# Patient Record
Sex: Female | Born: 1980 | Race: Black or African American | Hispanic: No | Marital: Single | Smoking: Never smoker
Health system: Southern US, Community
[De-identification: ages and names within clinical notes are randomized; demographics above are authoritative.]

## PROBLEM LIST (undated history)

## (undated) DIAGNOSIS — N62 Hypertrophy of breast: Secondary | ICD-10-CM

## (undated) DIAGNOSIS — Z975 Presence of (intrauterine) contraceptive device: Secondary | ICD-10-CM

## (undated) DIAGNOSIS — Z98811 Dental restoration status: Secondary | ICD-10-CM

## (undated) HISTORY — PX: WISDOM TOOTH EXTRACTION: SHX21

---

## 2007-09-17 ENCOUNTER — Ambulatory Visit: Payer: Self-pay | Admitting: Internal Medicine

## 2009-09-03 ENCOUNTER — Ambulatory Visit: Payer: Self-pay | Admitting: Family Medicine

## 2009-09-03 ENCOUNTER — Encounter: Payer: Self-pay | Admitting: Family Medicine

## 2009-09-03 DIAGNOSIS — N390 Urinary tract infection, site not specified: Secondary | ICD-10-CM | POA: Insufficient documentation

## 2009-09-03 DIAGNOSIS — R300 Dysuria: Secondary | ICD-10-CM | POA: Insufficient documentation

## 2009-09-03 LAB — CONVERTED CEMR LAB
Glucose, Urine, Semiquant: NEGATIVE
Ketones, urine, test strip: NEGATIVE
Nitrite: NEGATIVE
Protein, U semiquant: NEGATIVE
Specific Gravity, Urine: 1.02
Urobilinogen, UA: 0.2
pH: 6

## 2009-09-04 ENCOUNTER — Encounter: Payer: Self-pay | Admitting: Family Medicine

## 2009-09-22 ENCOUNTER — Ambulatory Visit: Payer: Self-pay | Admitting: Family Medicine

## 2009-09-23 LAB — CONVERTED CEMR LAB
ALT: 9 units/L (ref 0–35)
AST: 14 units/L (ref 0–37)
Albumin: 3.1 g/dL — ABNORMAL LOW (ref 3.5–5.2)
Alkaline Phosphatase: 44 units/L (ref 39–117)
BUN: 4 mg/dL — ABNORMAL LOW (ref 6–23)
Basophils Absolute: 0 10*3/uL (ref 0.0–0.1)
Basophils Relative: 0.6 % (ref 0.0–3.0)
Bilirubin, Direct: 0.1 mg/dL (ref 0.0–0.3)
CO2: 27 meq/L (ref 19–32)
Calcium: 9 mg/dL (ref 8.4–10.5)
Chloride: 106 meq/L (ref 96–112)
Cholesterol: 174 mg/dL (ref 0–200)
Creatinine, Ser: 0.8 mg/dL (ref 0.4–1.2)
Eosinophils Absolute: 0.1 10*3/uL (ref 0.0–0.7)
Eosinophils Relative: 1.9 % (ref 0.0–5.0)
GFR calc non Af Amer: 108.1 mL/min (ref >60)
Glucose, Bld: 71 mg/dL (ref 70–99)
HCT: 35.8 % — ABNORMAL LOW (ref 36.0–46.0)
HDL: 61.8 mg/dL (ref >39.00)
Hemoglobin: 11.4 g/dL — ABNORMAL LOW (ref 12.0–15.0)
Hgb A1c MFr Bld: 5.2 % (ref 4.6–6.5)
LDL Cholesterol: 92 mg/dL (ref 0–99)
Lymphocytes Relative: 25.9 % (ref 12.0–46.0)
Lymphs Abs: 1.9 10*3/uL (ref 0.7–4.0)
MCHC: 31.7 g/dL (ref 30.0–36.0)
MCV: 85.8 fL (ref 78.0–100.0)
Monocytes Absolute: 0.4 10*3/uL (ref 0.1–1.0)
Monocytes Relative: 5.8 % (ref 3.0–12.0)
Neutro Abs: 5 10*3/uL (ref 1.4–7.7)
Neutrophils Relative %: 65.8 % (ref 43.0–77.0)
Platelets: 313 10*3/uL (ref 150.0–400.0)
Potassium: 3.4 meq/L — ABNORMAL LOW (ref 3.5–5.1)
RBC: 4.17 M/uL (ref 3.87–5.11)
RDW: 11.9 % (ref 11.5–14.6)
Sodium: 139 meq/L (ref 135–145)
TSH: 1.81 microintl units/mL (ref 0.35–5.50)
Total Bilirubin: 0.3 mg/dL (ref 0.3–1.2)
Total CHOL/HDL Ratio: 3
Total Protein: 6.7 g/dL (ref 6.0–8.3)
Triglycerides: 102 mg/dL (ref 0.0–149.0)
VLDL: 20.4 mg/dL (ref 0.0–40.0)
WBC: 7.4 10*3/uL (ref 4.5–10.5)

## 2009-10-12 ENCOUNTER — Encounter: Payer: Self-pay | Admitting: Family Medicine

## 2009-10-12 LAB — CONVERTED CEMR LAB
Bilirubin Urine: NEGATIVE
Glucose, Urine, Semiquant: NEGATIVE
Ketones, urine, test strip: NEGATIVE
Nitrite: NEGATIVE
Protein, U semiquant: 30
Urobilinogen, UA: NEGATIVE

## 2009-10-15 ENCOUNTER — Ambulatory Visit: Payer: Self-pay | Admitting: Family Medicine

## 2010-01-27 ENCOUNTER — Encounter: Payer: Self-pay | Admitting: Family Medicine

## 2010-01-27 DIAGNOSIS — R252 Cramp and spasm: Secondary | ICD-10-CM | POA: Insufficient documentation

## 2010-01-28 ENCOUNTER — Ambulatory Visit: Payer: Self-pay | Admitting: Family Medicine

## 2010-01-28 LAB — CONVERTED CEMR LAB
BUN: 8 mg/dL (ref 6–23)
CO2: 31 meq/L (ref 19–32)
Calcium: 9.5 mg/dL (ref 8.4–10.5)
Chloride: 103 meq/L (ref 96–112)
Creatinine, Ser: 0.7 mg/dL (ref 0.4–1.2)
GFR calc non Af Amer: 121.8 mL/min (ref >60)
Glucose, Bld: 78 mg/dL (ref 70–99)
Potassium: 3.8 meq/L (ref 3.5–5.1)
Sodium: 140 meq/L (ref 135–145)

## 2010-09-14 ENCOUNTER — Ambulatory Visit
Admission: RE | Admit: 2010-09-14 | Discharge: 2010-09-14 | Payer: Self-pay | Source: Home / Self Care | Attending: Nurse Practitioner | Admitting: Nurse Practitioner

## 2010-09-15 NOTE — Assessment & Plan Note (Unsigned)
Stacy Obrien, Stacy Obrien                ACCOUNT NO.:  [id number]  MEDICAL RECORD NO.:  [id number]          PATIENT TYPE:  POB  LOCATION:  [facility] at [facility]         FACILITY:  [facility]  PHYSICIAN:  Jaynie Collins, MD     DATE OF BIRTH:  1981-02-20  DATE OF SERVICE:  09/14/2010                                 CLINIC NOTE  HISTORY OF PRESENT ILLNESS:  The patient comes to the office today for consultation on Implanon.  The patient has currently been using NuvaRing for birth control and though she is happy with her NuvaRing, she finds that it is costly, and that she would like something that is more convenient.  She does have a monogamous relationship and may ultimately get married, but at this time is not seeking pregnancy, and does not anticipate seeking pregnancy for several years.  OBSTETRICAL HISTORY:  G1, P0.  MENSTRUAL HISTORY:  First day of the last menstrual period was September 10, 2010.  Her menstrual cycle began at the age of 72.  Menstrual cycles are regular 28-day cycle 5 day period.  Medium light flow days, mild pain.  GYNECOLOGIC HISTORY:  Last Pap smear was April 2011.  One abnormal Pap smear followed by colpo which was normal and has not had a mammogram yet.  PAST SURGICAL HISTORY:  She has not had surgery.  FAMILY HISTORY:  High blood pressure and cancer.  PERSONAL HISTORY:  Denies personal history of arthritis, high blood pressure, and thyroid problems.  SOCIAL HISTORY:  The patient works outside of the home.  She drinks one caffeinated beverage per day, otherwise negative history.  REVIEW OF SYSTEMS:  Systems review is negative on 9 points.  PHYSICAL EXAMINATION:  VITAL SIGNS:  Blood pressure is 109/71, weight is 191, and height is 5 feet 5 inches.  ALLERGIES:  No known allergies.  We had a good discussion today concerning birth control options.  We reviewed Implanon and the NuvaRing at length.  We discussed the patient's menstrual cycle and fertility.  We  discussed breakthrough bleeding and side effects.  She was given a brochure on Implanon.  The patient is advised to come back when she is on her menstrual cycle.  She is advised to not have any unprotected intercourse.  She will return for Implanon insertion on October 01, 2010.     Remonia Richter, NP   ______________________________ Jaynie Collins, MD   LR/MEDQ  D:  09/14/2010  T:  09/15/2010  Job:  [id number]

## 2010-09-21 ENCOUNTER — Ambulatory Visit
Admission: RE | Admit: 2010-09-21 | Discharge: 2010-09-21 | Payer: Self-pay | Source: Home / Self Care | Attending: Nurse Practitioner | Admitting: Nurse Practitioner

## 2010-09-21 NOTE — Assessment & Plan Note (Signed)
Summary: NEW PT/SICK/DLO   Vital Signs:  Patient profile:   30 year old female Height:      66 inches Weight:      182 pounds BMI:     29.48 Temp:     98.3 degrees F oral Pulse rate:   88 / minute Pulse rhythm:   regular BP sitting:   130 / 64  (left arm) Cuff size:   regular  Vitals Entered By: Delilah Shan CMA Duncan Dull) (September 03, 2009 2:42 PM) CC: ? UTI   History of Present Illness: 3 weeks of increased urinary frequency, suprapubic pressure. Started on 5 day course of Bactrim, which she finished today, still having symptoms.  No hematuria, back pain, fevers, or chills. Was a little nauseated ths morning, but that has resolved. No vomiting.  Has had UTIs in past but it has been awhile.  Allergies (verified): No Known Drug Allergies  Review of Systems      See HPI General:  Denies chills and fever. GI:  Complains of nausea; denies vomiting. GU:  Complains of dysuria and urinary frequency; denies hematuria and incontinence.  Physical Exam  General:  very pleasant, NAD Abdomen:  NO Suprapubic tenderness Msk:  NO CVA tenderness Psych:  alert, very pleasant.   Impression & Recommendations:  Problem # 1:  UTI (ICD-599.0) Assessment New UA positive despite 5 day course of Bactrim. May be resistant, will send for culture and start treatment with Cipro (complicated UTI dosing). Her updated medication list for this problem includes:    Cipro 500 Mg Tabs (Ciprofloxacin hcl) .Marland Kitchen... 1 by mouth 2 times daily x 10 days  Complete Medication List: 1)  Nuvaring 0.12-0.015 Mg/24hr Ring (Etonogestrel-ethinyl estradiol) 2)  Cipro 500 Mg Tabs (Ciprofloxacin hcl) .Marland Kitchen.. 1 by mouth 2 times daily x 10 days 3)  Diflucan 150 Mg Tabs (Fluconazole) .... Once daily  Other Orders: UA Dipstick w/o Micro (manual) (81191) No Charge Patient Arrived (NCPA0) (NCPA0) Prescriptions: DIFLUCAN 150 MG TABS (FLUCONAZOLE) once daily  #1 x 0   Entered and Authorized by:   Ruthe Mannan MD   Signed  by:   Ruthe Mannan MD on 09/03/2009   Method used:   Electronically to        [facility]* (retail)       [address]       [place], [place]  [address]       Ph: [phone]       Fax: [phone]   RxID:   [id number] CIPRO 500 MG TABS (CIPROFLOXACIN HCL) 1 by mouth 2 times daily x 10 days  #20 x 0   Entered and Authorized by:   Ruthe Mannan MD   Signed by:   Ruthe Mannan MD on 09/03/2009   Method used:   Electronically to        [facility]* (retail)       [address]       [place], [place]  [address]       Ph: [phone]       Fax: [phone]   RxID:   [id number] CIPRO 500 MG TABS (CIPROFLOXACIN HCL) 1 by mouth 2 times daily x 10 days  #20 x 0   Entered and Authorized by:   Ruthe Mannan MD   Signed by:   Ruthe Mannan MD on 09/03/2009   Method used:   Print then Give to Patient   RxID:   [id number]   Current Allergies (reviewed today): No known allergies   Laboratory Results  Urine Tests   Date/Time Reported: September 03, 2009 2:44 PM   Routine Urinalysis   Color: yellow Appearance: Hazy Glucose: negative   (Normal Range: Negative) Bilirubin: small   (Normal Range: Negative) Ketone: negative   (Normal Range: Negative) Spec. Gravity: 1.020   (Normal Range: 1.003-1.035) Blood: large   (Normal Range: Negative) pH: 6.0   (Normal Range: 5.0-8.0) Protein: negative   (Normal Range: Negative) Urobilinogen: 0.2   (Normal Range: 0-1) Nitrite: negative   (Normal Range: Negative) Leukocyte Esterace: moderate   (Normal Range: Negative)        Appended Document: NEW PT/SICK/DLO

## 2010-09-21 NOTE — Miscellaneous (Signed)
Summary: Orders Update  Clinical Lists Changes  Problems: Added new problem of CRAMP IN LIMB (ICD-729.82) Orders: Added new Service order of Venipuncture (62952) - Signed Added new Test order of TLB-BMP (Basic Metabolic Panel-BMET) (80048-METABOL) - Signed

## 2010-09-21 NOTE — Miscellaneous (Signed)
Summary: Orders Update  Clinical Lists Changes  Problems: Added new problem of DYSURIA (ICD-788.1) Orders: Added new Test order of T-Culture, Urine ([id number]) - Signed

## 2010-09-21 NOTE — Miscellaneous (Signed)
Summary: Orders Update  Clinical Lists Changes  Medications: Added new medication of CIPRO 500 MG TABS (CIPROFLOXACIN HCL) 1 by mouth 2 times daily x 7 days. - Signed Rx of CIPRO 500 MG TABS (CIPROFLOXACIN HCL) 1 by mouth 2 times daily x 7 days.;  #14 x 0;  Signed;  Entered by: Ruthe Mannan MD;  Authorized by: Ruthe Mannan MD;  Method used: Electronically to [facility]*, [address], [place], [place]  [address], Ph: [phone], Fax: [phone] Orders: Added new Test order of T-Culture, Urine [id number]) - Signed Observations: Added new observation of WBC DIPSTK U: moderate (10/12/2009 15:45) Added new observation of NITRITE URN: negative (10/12/2009 15:45) Added new observation of UROBILINOGEN: negative (10/12/2009 15:45) Added new observation of PROTEIN, URN: 30  (10/12/2009 15:45) Added new observation of BLOOD UR DIP: large  (10/12/2009 15:45) Added new observation of KETONES URN: negative  (10/12/2009 15:45) Added new observation of BILIRUBIN UR: negative  (10/12/2009 15:45) Added new observation of GLUCOSE, URN: negative  (10/12/2009 15:45)    Prescriptions: CIPRO 500 MG TABS (CIPROFLOXACIN HCL) 1 by mouth 2 times daily x 7 days.  #14 x 0   Entered and Authorized by:   Ruthe Mannan MD   Signed by:   Ruthe Mannan MD on 10/12/2009   Method used:   Electronically to        [facility]* (retail)       [address]       [place], [place]  [address]       Ph: [phone]       Fax: [phone]   RxID:   [id number]   Laboratory Results   Urine Tests    Routine Urinalysis   Glucose: negative   (Normal Range: Negative) Bilirubin: negative   (Normal Range: Negative) Ketone: negative   (Normal Range: Negative) Blood: large   (Normal Range: Negative) Protein: 30   (Normal Range: Negative) Urobilinogen: negative   (Normal Range: 0-1) Nitrite: negative   (Normal Range: Negative) Leukocyte Esterace: moderate   (Normal Range: Negative)      Appended Document:  Orders Update

## 2010-09-22 NOTE — Assessment & Plan Note (Unsigned)
Stacy Obrien, Stacy Obrien                ACCOUNT NO.:  [id number]  MEDICAL RECORD NO.:  [id number]          PATIENT TYPE:  POB  LOCATION:  [facility] at [facility]         FACILITY:  [facility]  PHYSICIAN:  Allie Bossier, MD        DATE OF BIRTH:  Jul 30, 1981  DATE OF SERVICE:  09/21/2010                                 CLINIC NOTE  The patient comes to office today for insertion of Implanon.  The patient has had no unprotected intercourse in the last 2 weeks today. Today, she has a negative urine pregnancy test.  She was seen on September 14, 2010, for consultation for Implanon.  Please see that note.  VITAL SIGNS:  Blood pressure is 123/79, weight 191, pulse is 68.  Lot #[id number].  The patient's right arm was cleaned with Betadine.  Markings were made. 3 mL of lidocaine was injected for numbing.  Implanon was injected without any difficulty.  The patient and I were able to feel the rod. The area was cleaned.  A dressing was applied.  She was instructed on signs and symptoms of infection as well as a bandage change.  She is aware that she will need to use condoms for the next 2 weeks.  She will follow up as needed for her yearly exam.     Stacy Richter, NP   ______________________________ Allie Bossier, MD   LR/MEDQ  D:  09/21/2010  T:  09/22/2010  Job:  [id number]

## 2010-10-27 ENCOUNTER — Encounter: Payer: Self-pay | Admitting: Family Medicine

## 2010-10-27 ENCOUNTER — Telehealth: Payer: Self-pay | Admitting: Family Medicine

## 2010-10-27 DIAGNOSIS — N390 Urinary tract infection, site not specified: Secondary | ICD-10-CM

## 2010-10-27 LAB — CONVERTED CEMR LAB
Bilirubin Urine: NEGATIVE
Glucose, Urine, Semiquant: NEGATIVE
Ketones, urine, test strip: NEGATIVE
Nitrite: NEGATIVE
Specific Gravity, Urine: 1.01
Urobilinogen, UA: 0.2
pH: 5

## 2010-11-02 NOTE — Progress Notes (Signed)
Summary: ? uti  Phone Note Call from Patient   Summary of Call: Patient requesting a antibiotic and diflucan to be called to [facility] pharmacy . Initial call taken by: Benny Lennert CMA (AAMA),  October 27, 2010 12:02 PM    New/Updated Medications: CIPRO 250 MG TABS (CIPROFLOXACIN HCL) 1 by mouth 2 times daily x 7 days DIFLUCAN 150 MG TABS (FLUCONAZOLE) once daily Prescriptions: DIFLUCAN 150 MG TABS (FLUCONAZOLE) once daily  #1 x 0   Entered and Authorized by:   Ruthe Mannan MD   Signed by:   Ruthe Mannan MD on 10/27/2010   Method used:   Electronically to        [facility]* (retail)       [address]       [place], [place]  [address]       Ph: [phone]       Fax: [phone]   RxID:   [id number] CIPRO 250 MG TABS (CIPROFLOXACIN HCL) 1 by mouth 2 times daily x 7 days  #14 x 0   Entered and Authorized by:   Ruthe Mannan MD   Signed by:   Ruthe Mannan MD on 10/27/2010   Method used:   Electronically to        [facility]* (retail)       [address]       [place], [place]  [address]       Ph: [phone]       Fax: [phone]   RxID:   [id number]   Laboratory Results   Urine Tests  Date/Time Received: October 27, 2010 12:01 PM  Date/Time Reported: October 27, 2010 12:01 PM   Routine Urinalysis   Color: yellow Appearance: Clear Glucose: negative   (Normal Range: Negative) Bilirubin: negative   (Normal Range: Negative) Ketone: negative   (Normal Range: Negative) Spec. Gravity: 1.010   (Normal Range: 1.003-1.035) Blood: large   (Normal Range: Negative) pH: 5.0   (Normal Range: 5.0-8.0) Protein: trace   (Normal Range: Negative) Urobilinogen: 0.2   (Normal Range: 0-1) Nitrite: negative   (Normal Range: Negative) Leukocyte Esterace: moderate   (Normal Range: Negative)        Appended Document: ? uti    Clinical Lists Changes  Orders: Added new Test order of T-Culture, Urine ([id number]) - Signed      Appended Document: ? uti

## 2011-01-05 ENCOUNTER — Telehealth (INDEPENDENT_AMBULATORY_CARE_PROVIDER_SITE_OTHER): Payer: Self-pay | Admitting: *Deleted

## 2011-01-05 ENCOUNTER — Other Ambulatory Visit: Payer: Self-pay | Admitting: Family Medicine

## 2011-01-05 DIAGNOSIS — R300 Dysuria: Secondary | ICD-10-CM

## 2011-01-05 LAB — POCT URINALYSIS DIPSTICK
Bilirubin, UA: NEGATIVE
Color, UA: NEGATIVE
Glucose, UA: NEGATIVE
Ketones, UA: NEGATIVE
Nitrite, UA: NEGATIVE
Protein, UA: NEGATIVE
Spec Grav, UA: 1.005
Urobilinogen, UA: NEGATIVE
pH, UA: 6.5

## 2011-01-05 MED ORDER — FLUCONAZOLE 150 MG PO TABS: 150.0000 mg | ORAL_TABLET | Freq: Once | ORAL | Status: AC

## 2011-01-05 MED ORDER — CIPROFLOXACIN HCL 250 MG PO TABS: 250.0000 mg | ORAL_TABLET | Freq: Two times a day (BID) | ORAL | Status: AC

## 2011-01-05 NOTE — Telephone Encounter (Signed)
Please send rx to [facility] along with Diflucan.

## 2011-01-06 NOTE — Telephone Encounter (Signed)
Rx sent to pharmacy. Patient notified. 

## 2011-03-01 ENCOUNTER — Other Ambulatory Visit: Payer: Self-pay | Admitting: Family Medicine

## 2011-03-01 MED ORDER — CIPROFLOXACIN HCL 250 MG PO TABS: ORAL_TABLET | ORAL | Status: DC

## 2011-03-01 MED ORDER — FLUCONAZOLE 150 MG PO TABS: 150.0000 mg | ORAL_TABLET | Freq: Once | ORAL | Status: AC

## 2011-03-09 ENCOUNTER — Telehealth: Payer: Self-pay | Admitting: *Deleted

## 2011-03-09 ENCOUNTER — Other Ambulatory Visit: Payer: Self-pay | Admitting: Family Medicine

## 2011-03-09 DIAGNOSIS — R300 Dysuria: Secondary | ICD-10-CM

## 2011-03-09 MED ORDER — CEPHALEXIN 500 MG PO CAPS: ORAL_CAPSULE | ORAL | Status: AC

## 2011-03-09 NOTE — Telephone Encounter (Signed)
Order placed

## 2011-03-09 NOTE — Telephone Encounter (Signed)
Stacy Obrien said that she finished antibiotic for UTI today and checked her urine and it still has leukocytes and blood in it. She says that you wanted her to send for culture. Will need order please.

## 2011-03-10 LAB — URINE CULTURE
Colony Count: NO GROWTH
Organism ID, Bacteria: NO GROWTH

## 2011-03-18 ENCOUNTER — Other Ambulatory Visit: Payer: Self-pay | Admitting: Family Medicine

## 2011-03-25 ENCOUNTER — Other Ambulatory Visit: Payer: Self-pay | Admitting: Family Medicine

## 2011-03-25 MED ORDER — BENZONATATE 100 MG PO CAPS: 100.0000 mg | ORAL_CAPSULE | Freq: Four times a day (QID) | ORAL | Status: DC | PRN

## 2011-03-28 ENCOUNTER — Other Ambulatory Visit: Payer: Self-pay | Admitting: Family Medicine

## 2011-03-28 MED ORDER — AZITHROMYCIN 250 MG PO TABS: ORAL_TABLET | ORAL | Status: AC

## 2011-03-29 ENCOUNTER — Other Ambulatory Visit: Payer: Self-pay | Admitting: Family Medicine

## 2011-03-29 MED ORDER — SCOPOLAMINE 1 MG/3DAYS TD PT72: 1.0000 | MEDICATED_PATCH | TRANSDERMAL | Status: DC

## 2011-07-06 ENCOUNTER — Ambulatory Visit: Payer: 59 | Admitting: Family Medicine

## 2011-08-23 HISTORY — PX: REDUCTION MAMMAPLASTY: SUR839

## 2011-08-29 ENCOUNTER — Telehealth: Payer: Self-pay | Admitting: *Deleted

## 2011-08-29 MED ORDER — FLUCONAZOLE 150 MG PO TABS: 150.0000 mg | ORAL_TABLET | Freq: Once | ORAL | Status: AC

## 2011-08-29 MED ORDER — CIPROFLOXACIN HCL 250 MG PO TABS: ORAL_TABLET | ORAL | Status: DC

## 2011-08-29 NOTE — Telephone Encounter (Signed)
Patient had ua with moderate blood, patient has no buning back pain or stomach pain only pressure. Patient also request a rx for diflucan

## 2011-09-05 ENCOUNTER — Other Ambulatory Visit: Payer: Self-pay | Admitting: Family Medicine

## 2011-09-05 MED ORDER — NYSTATIN-TRIAMCINOLONE 100000-0.1 UNIT/GM-% EX OINT: TOPICAL_OINTMENT | Freq: Two times a day (BID) | CUTANEOUS | Status: DC

## 2011-09-07 ENCOUNTER — Encounter: Payer: Self-pay | Admitting: Family Medicine

## 2011-09-07 ENCOUNTER — Ambulatory Visit (INDEPENDENT_AMBULATORY_CARE_PROVIDER_SITE_OTHER): Payer: 59 | Admitting: Family Medicine

## 2011-09-07 VITALS — BP 130/84 | HR 79 | Temp 98.4°F | Ht 65.5 in | Wt 182.0 lb

## 2011-09-07 DIAGNOSIS — M65319 Trigger thumb, unspecified thumb: Secondary | ICD-10-CM

## 2011-09-07 DIAGNOSIS — M6530 Trigger finger, unspecified finger: Secondary | ICD-10-CM

## 2011-09-07 NOTE — Progress Notes (Signed)
Procedure Note:  R trigger thumb, 1 mo  Trigger Finger Injection Verbal consent was obtained. Risks (including rare risk of infection, potential risk for skin lightening and potential atrophy), benefits and alternatives were discussed. Prepped with Chloraprep and Ethyl Chloride used for anesthesia. Under sterile conditions, patient injected at palmar crease aiming distally with 45 degree angle towards nodule; injected directly into tendon sheath. Medication flowed freely without resistance.  Needle size: 22 gauge 1 1/2 inch Injection: 1/2 cc of Lidocaine 1% and 1/2 cc of Depo-Medrol 40 mg

## 2011-09-23 ENCOUNTER — Other Ambulatory Visit: Payer: Self-pay | Admitting: Family Medicine

## 2011-09-23 DIAGNOSIS — N390 Urinary tract infection, site not specified: Secondary | ICD-10-CM

## 2011-09-23 DIAGNOSIS — R300 Dysuria: Secondary | ICD-10-CM

## 2011-12-15 ENCOUNTER — Encounter: Payer: Self-pay | Admitting: Family Medicine

## 2011-12-15 ENCOUNTER — Ambulatory Visit (INDEPENDENT_AMBULATORY_CARE_PROVIDER_SITE_OTHER): Payer: 59 | Admitting: Family Medicine

## 2011-12-15 ENCOUNTER — Ambulatory Visit: Payer: 59 | Admitting: Family Medicine

## 2011-12-15 VITALS — BP 122/80 | Temp 97.8°F | Wt 192.0 lb

## 2011-12-15 DIAGNOSIS — G8929 Other chronic pain: Secondary | ICD-10-CM | POA: Insufficient documentation

## 2011-12-15 DIAGNOSIS — M549 Dorsalgia, unspecified: Secondary | ICD-10-CM

## 2011-12-15 MED ORDER — CYCLOBENZAPRINE HCL 5 MG PO TABS: 5.0000 mg | ORAL_TABLET | Freq: Three times a day (TID) | ORAL | Status: AC | PRN

## 2011-12-15 NOTE — Patient Instructions (Signed)
Great to see you. Please stop by to see Marion on your way out.   

## 2011-12-15 NOTE — Progress Notes (Signed)
  Subjective:    Patient ID: Stacy Obrien, female    DOB: 01-May-1981, 31 y.o.   MRN: [id number]  HPI Very pleasant, healthy 31 yo female here for upper back and trapezius pain.  Feels it is due to her large breasts- wears at 40DD.  Often even wakes up with pain. Has indentations in her shoulders from her bra. Has tried exercise, losing weight but breasts remain same size.  Considering breast reduction.  Patient Active Problem List  Diagnoses  . UTI  . CRAMP IN LIMB  . DYSURIA  . Chronic upper back pain   No past medical history on file. No past surgical history on file. History  Substance Use Topics  . Smoking status: Never Smoker   . Smokeless tobacco: Not on file  . Alcohol Use: Not on file   No family history on file. No Known Allergies No current outpatient prescriptions on file prior to visit.   The PMH, PSH, Social History, Family History, Medications, and allergies have been reviewed in [facility], and have been updated if relevant.    Review of Systems See HPI   Objective:   Physical Exam BP 122/80  Temp(Src) 97.8 F (36.6 C) (Oral)  Wt 192 lb (87.091 kg)  General:  Well-developed,well-nourished,in no acute distress; alert,appropriate and cooperative throughout examination Head:  normocephalic and atraumatic.   Msk:  No deformity or scoliosis noted of thoracic or lumbar spine.  Pos trapezius spasm bilaterally, large indentations from bra lines bilateral shoulders. Psych:  Cognition and judgment appear intact. Alert and cooperative with normal attention span and concentration. No apparent delusions, illusions, hallucinations        Assessment & Plan:   1. Chronic upper back pain  Ambulatory referral to Plastic Surgery   >15 min spent with face to face with patient, >50% counseling and/or coordinating care. Will refer to surgery to discuss breast reduction- I agree this is a good course of action since conservative management has not been helpful.  Also will  start flexeril as needed. The patient indicates understanding of these issues and agrees with the plan.

## 2012-01-20 ENCOUNTER — Ambulatory Visit: Payer: 59 | Admitting: Family Medicine

## 2012-04-17 ENCOUNTER — Ambulatory Visit (INDEPENDENT_AMBULATORY_CARE_PROVIDER_SITE_OTHER): Payer: 59 | Admitting: Family Medicine

## 2012-04-17 ENCOUNTER — Encounter: Payer: Self-pay | Admitting: Family Medicine

## 2012-04-17 VITALS — BP 122/76 | HR 80 | Temp 98.3°F | Wt 193.2 lb

## 2012-04-17 DIAGNOSIS — J019 Acute sinusitis, unspecified: Secondary | ICD-10-CM | POA: Insufficient documentation

## 2012-04-17 MED ORDER — AMOXICILLIN-POT CLAVULANATE 875-125 MG PO TABS: 1.0000 | ORAL_TABLET | Freq: Two times a day (BID) | ORAL | Status: AC

## 2012-04-17 NOTE — Patient Instructions (Signed)
You have a sinus infection but likely viral. Push fluids and plenty of rest. If going on past 9-10 days or any worsening prior, fill antibiotic. May use simple mucinex with plenty of fluid to help mobilize mucous. May use ibuprofen for discomfort. Let us know if fever >101.5, trouble opening/closing mouth, difficulty swallowing, or worsening - you may need to be seen again.

## 2012-04-17 NOTE — Assessment & Plan Note (Signed)
5-6 days into illness. Anticipate viral sinusitis, discussed this. Give more time, treat supportively with mucinex and ibuprofen and fluid. If worsening or not better after 9-10 days, fill augmentin course. Pt agrees with plan.

## 2012-04-17 NOTE — Progress Notes (Signed)
  Subjective:    Patient ID: Stacy Obrien, female    DOB: April 03, 1981, 31 y.o.   MRN: [id number]  HPI CC: sinusitis?  5-6 d h/o sinus congestion, ST, now with tooth ache, left ear pain, facial pain, headache described as frontal pressure headache and behind eyes.  Initially purulent mucous.  Now more rhinorrhea.  Mild cough in am.  So far has tried claritin as well as Careers adviser.  Doesn't like nasal sprays.  No fevers/chills, abd pain, nausea.  No sick contacts at home.  No smokers at home.  No h/o asthma.  No past medical history on file.  Review of Systems Per HPI    Objective:   Physical Exam  Nursing note and vitals reviewed. Constitutional: She appears well-developed and well-nourished. No distress.  HENT:  Head: Normocephalic and atraumatic.  Right Ear: Hearing, tympanic membrane, external ear and ear canal normal.  Left Ear: Hearing, tympanic membrane, external ear and ear canal normal.  Nose: No mucosal edema or rhinorrhea. Right sinus exhibits maxillary sinus tenderness. Right sinus exhibits no frontal sinus tenderness. Left sinus exhibits maxillary sinus tenderness. Left sinus exhibits no frontal sinus tenderness.  Mouth/Throat: Uvula is midline, oropharynx is clear and moist and mucous membranes are normal. No oropharyngeal exudate, posterior oropharyngeal edema, posterior oropharyngeal erythema or tonsillar abscesses.       Mild tenderness with palpation at tragus Mild maxillary sinus pressure/pain with palpation  Eyes: Conjunctivae and EOM are normal. Pupils are equal, round, and reactive to light. No scleral icterus.  Neck: Normal range of motion. Neck supple.  Cardiovascular: Normal rate, regular rhythm, normal heart sounds and intact distal pulses.   No murmur heard. Pulmonary/Chest: Effort normal and breath sounds normal. No respiratory distress. She has no wheezes. She has no rales.  Lymphadenopathy:    She has no cervical adenopathy.  Skin: Skin is warm and dry.  No rash noted.       Assessment & Plan:

## 2012-04-24 ENCOUNTER — Encounter (HOSPITAL_BASED_OUTPATIENT_CLINIC_OR_DEPARTMENT_OTHER): Admission: RE | Payer: Self-pay | Source: Ambulatory Visit

## 2012-04-24 ENCOUNTER — Ambulatory Visit (HOSPITAL_BASED_OUTPATIENT_CLINIC_OR_DEPARTMENT_OTHER): Admission: RE | Admit: 2012-04-24 | Payer: 59 | Source: Ambulatory Visit | Admitting: Plastic Surgery

## 2012-04-24 SURGERY — MAMMOPLASTY, REDUCTION
Anesthesia: General | Laterality: Bilateral

## 2012-05-14 ENCOUNTER — Telehealth: Payer: Self-pay | Admitting: Family Medicine

## 2012-05-14 ENCOUNTER — Other Ambulatory Visit (INDEPENDENT_AMBULATORY_CARE_PROVIDER_SITE_OTHER): Payer: 59

## 2012-05-14 DIAGNOSIS — R5383 Other fatigue: Secondary | ICD-10-CM

## 2012-05-14 DIAGNOSIS — R5381 Other malaise: Secondary | ICD-10-CM

## 2012-05-14 NOTE — Telephone Encounter (Signed)
Pt had appt to see me tomorrow for fatigue and headaches.  Had to cancel due to her work schedule.  She asked that order labs that she can have drawn at [facility].  Labs orders entered.

## 2012-05-15 ENCOUNTER — Ambulatory Visit: Payer: 59 | Admitting: Family Medicine

## 2012-05-15 LAB — CBC WITH DIFFERENTIAL/PLATELET
Basophils Absolute: 0 10*3/uL (ref 0.0–0.1)
Basophils Relative: 0.2 % (ref 0.0–3.0)
Eosinophils Absolute: 0.1 10*3/uL (ref 0.0–0.7)
Eosinophils Relative: 1 % (ref 0.0–5.0)
HCT: 33.2 % — ABNORMAL LOW (ref 36.0–46.0)
Hemoglobin: 10.7 g/dL — ABNORMAL LOW (ref 12.0–15.0)
Lymphocytes Relative: 24.8 % (ref 12.0–46.0)
Lymphs Abs: 2.2 10*3/uL (ref 0.7–4.0)
MCHC: 32.4 g/dL (ref 30.0–36.0)
MCV: 87.3 fl (ref 78.0–100.0)
Monocytes Absolute: 0.3 10*3/uL (ref 0.1–1.0)
Monocytes Relative: 3.7 % (ref 3.0–12.0)
Neutro Abs: 6.3 10*3/uL (ref 1.4–7.7)
Neutrophils Relative %: 70.3 % (ref 43.0–77.0)
Platelets: 292 10*3/uL (ref 150.0–400.0)
RBC: 3.8 Mil/uL — ABNORMAL LOW (ref 3.87–5.11)
RDW: 12.9 % (ref 11.5–14.6)
WBC: 8.9 10*3/uL (ref 4.5–10.5)

## 2012-05-15 LAB — COMPREHENSIVE METABOLIC PANEL
ALT: 10 U/L (ref 0–35)
AST: 17 U/L (ref 0–37)
Albumin: 3.6 g/dL (ref 3.5–5.2)
Alkaline Phosphatase: 63 U/L (ref 39–117)
BUN: 7 mg/dL (ref 6–23)
CO2: 26 mEq/L (ref 19–32)
Calcium: 9.2 mg/dL (ref 8.4–10.5)
Chloride: 102 mEq/L (ref 96–112)
Creatinine, Ser: 0.7 mg/dL (ref 0.4–1.2)
GFR: 123.99 mL/min (ref >60.00)
Glucose, Bld: 80 mg/dL (ref 70–99)
Potassium: 3.7 mEq/L (ref 3.5–5.1)
Sodium: 142 mEq/L (ref 135–145)
Total Bilirubin: 0.5 mg/dL (ref 0.3–1.2)
Total Protein: 7.5 g/dL (ref 6.0–8.3)

## 2012-05-15 LAB — TSH: TSH: 1.05 u[IU]/mL (ref 0.35–5.50)

## 2012-05-16 ENCOUNTER — Other Ambulatory Visit: Payer: Self-pay | Admitting: Internal Medicine

## 2012-05-16 MED ORDER — OSELTAMIVIR PHOSPHATE 75 MG PO CAPS: 75.0000 mg | ORAL_CAPSULE | Freq: Two times a day (BID) | ORAL | Status: DC

## 2012-05-22 ENCOUNTER — Telehealth: Payer: Self-pay | Admitting: Family Medicine

## 2012-05-22 NOTE — Telephone Encounter (Signed)
Patient sent me a text message stating that she has been out of work since Friday with the flu. She requests a note stating that she has been out with the flu and would like it faxed to [facility] at [phone]. Please fax note as entered.

## 2012-05-22 NOTE — Telephone Encounter (Signed)
Letter faxed.

## 2012-06-04 ENCOUNTER — Other Ambulatory Visit (INDEPENDENT_AMBULATORY_CARE_PROVIDER_SITE_OTHER): Payer: 59

## 2012-06-04 ENCOUNTER — Other Ambulatory Visit: Payer: Self-pay | Admitting: Family Medicine

## 2012-06-04 DIAGNOSIS — Z136 Encounter for screening for cardiovascular disorders: Secondary | ICD-10-CM

## 2012-06-04 DIAGNOSIS — Z113 Encounter for screening for infections with a predominantly sexual mode of transmission: Secondary | ICD-10-CM

## 2012-06-04 DIAGNOSIS — Z0000 Encounter for general adult medical examination without abnormal findings: Secondary | ICD-10-CM

## 2012-06-04 LAB — COMPREHENSIVE METABOLIC PANEL
ALT: 8 U/L (ref 0–35)
AST: 18 U/L (ref 0–37)
Albumin: 3.7 g/dL (ref 3.5–5.2)
Alkaline Phosphatase: 68 U/L (ref 39–117)
BUN: 8 mg/dL (ref 6–23)
CO2: 29 mEq/L (ref 19–32)
Calcium: 9.4 mg/dL (ref 8.4–10.5)
Chloride: 103 mEq/L (ref 96–112)
Creatinine, Ser: 0.7 mg/dL (ref 0.4–1.2)
GFR: 126.02 mL/min (ref >60.00)
Glucose, Bld: 82 mg/dL (ref 70–99)
Potassium: 3.3 mEq/L — ABNORMAL LOW (ref 3.5–5.1)
Sodium: 140 mEq/L (ref 135–145)
Total Bilirubin: 0.6 mg/dL (ref 0.3–1.2)
Total Protein: 7.6 g/dL (ref 6.0–8.3)

## 2012-06-04 LAB — LIPID PANEL
Cholesterol: 182 mg/dL (ref 0–200)
HDL: 40.6 mg/dL (ref >39.00)
LDL Cholesterol: 124 mg/dL — ABNORMAL HIGH (ref 0–99)
Total CHOL/HDL Ratio: 4
Triglycerides: 89 mg/dL (ref 0.0–149.0)
VLDL: 17.8 mg/dL (ref 0.0–40.0)

## 2012-06-05 LAB — RPR

## 2012-06-05 LAB — GC PROBE AMPLIFICATION, URINE: GC Probe Amp, Urine: NEGATIVE

## 2012-06-05 LAB — HIV ANTIBODY (ROUTINE TESTING W REFLEX): HIV: NONREACTIVE

## 2012-06-22 DIAGNOSIS — N62 Hypertrophy of breast: Secondary | ICD-10-CM

## 2012-06-22 HISTORY — DX: Hypertrophy of breast: N62

## 2012-07-05 ENCOUNTER — Encounter (HOSPITAL_BASED_OUTPATIENT_CLINIC_OR_DEPARTMENT_OTHER): Payer: Self-pay | Admitting: *Deleted

## 2012-07-12 ENCOUNTER — Encounter (HOSPITAL_BASED_OUTPATIENT_CLINIC_OR_DEPARTMENT_OTHER)
Admission: RE | Disposition: A | Discharge status: Home / Self Care | Payer: Self-pay | Source: Ambulatory Visit | Attending: Plastic Surgery

## 2012-07-12 ENCOUNTER — Ambulatory Visit (HOSPITAL_BASED_OUTPATIENT_CLINIC_OR_DEPARTMENT_OTHER): Payer: 59 | Admitting: Anesthesiology

## 2012-07-12 ENCOUNTER — Encounter (HOSPITAL_BASED_OUTPATIENT_CLINIC_OR_DEPARTMENT_OTHER): Payer: Self-pay | Admitting: Anesthesiology

## 2012-07-12 ENCOUNTER — Encounter (HOSPITAL_BASED_OUTPATIENT_CLINIC_OR_DEPARTMENT_OTHER): Payer: Self-pay | Admitting: *Deleted

## 2012-07-12 ENCOUNTER — Ambulatory Visit (HOSPITAL_BASED_OUTPATIENT_CLINIC_OR_DEPARTMENT_OTHER)
Admission: RE | Admit: 2012-07-12 | Discharge: 2012-07-13 | Disposition: A | Discharge status: Home / Self Care | Payer: 59 | Source: Ambulatory Visit | Attending: Plastic Surgery | Admitting: Plastic Surgery

## 2012-07-12 DIAGNOSIS — N62 Hypertrophy of breast: Secondary | ICD-10-CM | POA: Insufficient documentation

## 2012-07-12 HISTORY — DX: Hypertrophy of breast: N62

## 2012-07-12 HISTORY — DX: Presence of (intrauterine) contraceptive device: Z97.5

## 2012-07-12 HISTORY — PX: BREAST REDUCTION SURGERY: SHX8

## 2012-07-12 HISTORY — DX: Dental restoration status: Z98.811

## 2012-07-12 LAB — POCT HEMOGLOBIN-HEMACUE: Hemoglobin: 12 g/dL (ref 12.0–15.0)

## 2012-07-12 SURGERY — MAMMOPLASTY, REDUCTION
Anesthesia: General | Site: Breast | Laterality: Bilateral | Wound class: Clean

## 2012-07-12 MED ORDER — HYDROCODONE-ACETAMINOPHEN 5-325 MG PO TABS
1.0000 | ORAL_TABLET | ORAL | Status: DC | PRN
  Administered 2012-07-13: 1 via ORAL

## 2012-07-12 MED ORDER — ROCURONIUM BROMIDE 100 MG/10ML IV SOLN
INTRAVENOUS | Status: DC | PRN
  Administered 2012-07-12 (×2): 10 mg via INTRAVENOUS
  Administered 2012-07-12: 30 mg via INTRAVENOUS

## 2012-07-12 MED ORDER — CEFAZOLIN SODIUM 1-5 GM-% IV SOLN
1.0000 g | Freq: Four times a day (QID) | INTRAVENOUS | Status: AC
  Administered 2012-07-12 – 2012-07-13 (×3): 1 g via INTRAVENOUS

## 2012-07-12 MED ORDER — NEOSTIGMINE METHYLSULFATE 1 MG/ML IJ SOLN
INTRAMUSCULAR | Status: DC | PRN
  Administered 2012-07-12: 3 mg via INTRAVENOUS

## 2012-07-12 MED ORDER — HYDROCODONE-ACETAMINOPHEN 5-325 MG PO TABS: 1.0000 | ORAL_TABLET | ORAL | Status: DC | PRN

## 2012-07-12 MED ORDER — FENTANYL CITRATE 0.05 MG/ML IJ SOLN
INTRAMUSCULAR | Status: DC | PRN
  Administered 2012-07-12: 25 ug via INTRAVENOUS
  Administered 2012-07-12: 50 ug via INTRAVENOUS
  Administered 2012-07-12: 100 ug via INTRAVENOUS
  Administered 2012-07-12: 25 ug via INTRAVENOUS
  Administered 2012-07-12 (×4): 100 ug via INTRAVENOUS

## 2012-07-12 MED ORDER — ONDANSETRON HCL 4 MG/2ML IJ SOLN
INTRAMUSCULAR | Status: DC | PRN
  Administered 2012-07-12: 4 mg via INTRAVENOUS

## 2012-07-12 MED ORDER — ONDANSETRON HCL 4 MG/2ML IJ SOLN: 4.0000 mg | Freq: Four times a day (QID) | INTRAMUSCULAR | Status: DC | PRN

## 2012-07-12 MED ORDER — MIDAZOLAM HCL 5 MG/5ML IJ SOLN
INTRAMUSCULAR | Status: DC | PRN
  Administered 2012-07-12: 2 mg via INTRAVENOUS

## 2012-07-12 MED ORDER — MORPHINE SULFATE 4 MG/ML IJ SOLN: 8.0000 mg | INTRAMUSCULAR | Status: DC | PRN

## 2012-07-12 MED ORDER — MORPHINE SULFATE 4 MG/ML IJ SOLN
8.0000 mg | INTRAMUSCULAR | Status: DC | PRN
  Administered 2012-07-12 (×2): 8 mg via INTRAMUSCULAR

## 2012-07-12 MED ORDER — OXYCODONE HCL 5 MG PO TABS: 5.0000 mg | ORAL_TABLET | Freq: Once | ORAL | Status: DC | PRN

## 2012-07-12 MED ORDER — DEXAMETHASONE SODIUM PHOSPHATE 4 MG/ML IJ SOLN
INTRAMUSCULAR | Status: DC | PRN
  Administered 2012-07-12: 10 mg via INTRAVENOUS

## 2012-07-12 MED ORDER — SUCCINYLCHOLINE CHLORIDE 20 MG/ML IJ SOLN
INTRAMUSCULAR | Status: DC | PRN
  Administered 2012-07-12: 100 mg via INTRAVENOUS

## 2012-07-12 MED ORDER — HYDROMORPHONE HCL PF 1 MG/ML IJ SOLN
0.2500 mg | INTRAMUSCULAR | Status: DC | PRN
  Administered 2012-07-12 (×2): 0.5 mg via INTRAVENOUS

## 2012-07-12 MED ORDER — CEFAZOLIN SODIUM 1-5 GM-% IV SOLN: 1.0000 g | Freq: Four times a day (QID) | INTRAVENOUS | Status: DC

## 2012-07-12 MED ORDER — GLYCOPYRROLATE 0.2 MG/ML IJ SOLN
INTRAMUSCULAR | Status: DC | PRN
  Administered 2012-07-12: 0.4 mg via INTRAVENOUS

## 2012-07-12 MED ORDER — ESMOLOL HCL 10 MG/ML IV SOLN
INTRAVENOUS | Status: DC | PRN
  Administered 2012-07-12: 30 mg via INTRAVENOUS

## 2012-07-12 MED ORDER — PROPOFOL 10 MG/ML IV BOLUS
INTRAVENOUS | Status: DC | PRN
  Administered 2012-07-12: 200 mg via INTRAVENOUS

## 2012-07-12 MED ORDER — LACTATED RINGERS IV SOLN
INTRAVENOUS | Status: DC
  Administered 2012-07-12 (×3): via INTRAVENOUS

## 2012-07-12 MED ORDER — OXYCODONE HCL 5 MG/5ML PO SOLN: 5.0000 mg | Freq: Once | ORAL | Status: DC | PRN

## 2012-07-12 MED ORDER — CEFAZOLIN SODIUM-DEXTROSE 2-3 GM-% IV SOLR
2.0000 g | INTRAVENOUS | Status: AC
  Administered 2012-07-12: 2 g via INTRAVENOUS

## 2012-07-12 MED ORDER — PROMETHAZINE HCL 25 MG/ML IJ SOLN
6.2500 mg | INTRAMUSCULAR | Status: DC | PRN
  Administered 2012-07-12 – 2012-07-13 (×2): 6.25 mg via INTRAVENOUS

## 2012-07-12 MED ORDER — ZOLPIDEM TARTRATE 10 MG PO TABS: 10.0000 mg | ORAL_TABLET | Freq: Every evening | ORAL | Status: DC | PRN

## 2012-07-12 MED ORDER — DEXTROSE IN LACTATED RINGERS 5 % IV SOLN
INTRAVENOUS | Status: DC
  Administered 2012-07-12: 75 mL/h via INTRAVENOUS

## 2012-07-12 SURGICAL SUPPLY — 52 items
ATCH SMKEVC FLXB CAUT HNDSWH (FILTER) ×1 IMPLANT
BANDAGE ELASTIC 6 VELCRO ST LF (GAUZE/BANDAGES/DRESSINGS) ×4 IMPLANT
BLADE SURG 10 STRL SS (BLADE) ×4 IMPLANT
CLOTH BEACON ORANGE TIMEOUT ST (SAFETY) ×2 IMPLANT
COTTONBALL LRG STERILE PKG (GAUZE/BANDAGES/DRESSINGS) IMPLANT
COVER MAYO STAND STRL (DRAPES) ×2 IMPLANT
COVER SURGICAL LIGHT HANDLE (MISCELLANEOUS) IMPLANT
COVER TABLE BACK 60X90 (DRAPES) ×2 IMPLANT
DRAPE LAPAROSCOPIC ABDOMINAL (DRAPES) ×2 IMPLANT
DRSG PAD ABDOMINAL 8X10 ST (GAUZE/BANDAGES/DRESSINGS) ×4 IMPLANT
ELECT CAUTERY BLADE 6.4 (BLADE) IMPLANT
ELECT REM PT RETURN 9FT ADLT (ELECTROSURGICAL) ×2
ELECTRODE REM PT RTRN 9FT ADLT (ELECTROSURGICAL) ×1 IMPLANT
EVACUATOR SMOKE ACCUVAC VALLEY (FILTER) ×1
GAUZE XEROFORM 5X9 LF (GAUZE/BANDAGES/DRESSINGS) ×4 IMPLANT
GLOVE BIO SURGEON STRL SZ 6.5 (GLOVE) ×8 IMPLANT
GLOVE BIO SURGEON STRL SZ8 (GLOVE) ×2 IMPLANT
GLOVE BIOGEL M 7.0 STRL (GLOVE) ×2 IMPLANT
GLOVE BIOGEL PI IND STRL 7.0 (GLOVE) ×1 IMPLANT
GLOVE BIOGEL PI IND STRL 7.5 (GLOVE) ×1 IMPLANT
GLOVE BIOGEL PI IND STRL 8 (GLOVE) ×1 IMPLANT
GLOVE BIOGEL PI INDICATOR 7.0 (GLOVE) ×1
GLOVE BIOGEL PI INDICATOR 7.5 (GLOVE) ×1
GLOVE BIOGEL PI INDICATOR 8 (GLOVE) ×1
GOWN PREVENTION PLUS XLARGE (GOWN DISPOSABLE) ×6 IMPLANT
GOWN PREVENTION PLUS XXLARGE (GOWN DISPOSABLE) ×2 IMPLANT
GOWN STRL NON-REIN LRG LVL3 (GOWN DISPOSABLE) IMPLANT
KIT BASIN OR (CUSTOM PROCEDURE TRAY) IMPLANT
KIT ROOM TURNOVER OR (KITS) IMPLANT
NS IRRIG 1000ML POUR BTL (IV SOLUTION) ×2 IMPLANT
PACK BASIN DAY SURGERY FS (CUSTOM PROCEDURE TRAY) ×2 IMPLANT
PACK GENERAL/GYN (CUSTOM PROCEDURE TRAY) IMPLANT
PAD ARMBOARD 7.5X6 YLW CONV (MISCELLANEOUS) IMPLANT
PEN SKIN MARKING BROAD (MISCELLANEOUS) ×2 IMPLANT
PREFILTER EVAC NS 1 1/3-3/8IN (MISCELLANEOUS) ×2 IMPLANT
SPECIMEN JAR LARGE (MISCELLANEOUS) ×4 IMPLANT
SPONGE GAUZE 4X4 12PLY (GAUZE/BANDAGES/DRESSINGS) ×4 IMPLANT
SPONGE LAP 18X18 X RAY DECT (DISPOSABLE) ×10 IMPLANT
STRIP CLOSURE SKIN 1/2X4 (GAUZE/BANDAGES/DRESSINGS) ×4 IMPLANT
SUT MNCRL AB 3-0 PS2 18 (SUTURE) ×24 IMPLANT
SUT MNCRL AB 4-0 PS2 18 (SUTURE) ×4 IMPLANT
SUT PROLENE 5 0 P 3 (SUTURE) IMPLANT
SUT VIC AB 2-0 FS1 27 (SUTURE) IMPLANT
SUT VIC AB 2-0 PS2 27 (SUTURE) ×4 IMPLANT
SYR BULB 3OZ (MISCELLANEOUS) ×2 IMPLANT
TOWEL OR 17X24 6PK STRL BLUE (TOWEL DISPOSABLE) ×4 IMPLANT
TOWEL OR 17X26 10 PK STRL BLUE (TOWEL DISPOSABLE) IMPLANT
TRAY DSU PREP LF (CUSTOM PROCEDURE TRAY) ×2 IMPLANT
TUBE CONNECTING 20X1/4 (TUBING) ×2 IMPLANT
TUBING 1/4  WITH 7/8  ADAPTER (MISCELLANEOUS) ×2 IMPLANT
WATER STERILE IRR 1000ML POUR (IV SOLUTION) IMPLANT
YANKAUER SUCT BULB TIP NO VENT (SUCTIONS) ×2 IMPLANT

## 2012-07-12 NOTE — Transfer of Care (Signed)
Immediate Anesthesia Transfer of Care Note  Patient: Stacy Obrien  Procedure(s) Performed: Procedure(s) (LRB) with comments: MAMMARY REDUCTION  (BREAST) (Bilateral)  Patient Location: PACU  Anesthesia Type:General  Level of Consciousness: awake, alert  and oriented  Airway & Oxygen Therapy: Patient Spontanous Breathing and Patient connected to face mask oxygen  Post-op Assessment: Report given to PACU RN and Post -op Vital signs reviewed and stable  Post vital signs: Reviewed and stable  Complications: No apparent anesthesia complications

## 2012-07-12 NOTE — Anesthesia Preprocedure Evaluation (Signed)
Anesthesia Evaluation  Patient identified by MRN, date of birth, ID band Patient awake    Reviewed: Allergy & Precautions, H&P , NPO status , Patient's Chart, lab work & pertinent test results  Airway Mallampati: II  Neck ROM: full    Dental   Pulmonary          Cardiovascular     Neuro/Psych    GI/Hepatic   Endo/Other  obese  Renal/GU      Musculoskeletal   Abdominal   Peds  Hematology   Anesthesia Other Findings   Reproductive/Obstetrics                           Anesthesia Physical Anesthesia Plan  ASA: II  Anesthesia Plan: General   Post-op Pain Management:    Induction: Intravenous  Airway Management Planned: Oral ETT  Additional Equipment:   Intra-op Plan:   Post-operative Plan: Extubation in OR  Informed Consent: I have reviewed the patients History and Physical, chart, labs and discussed the procedure including the risks, benefits and alternatives for the proposed anesthesia with the patient or authorized representative who has indicated his/her understanding and acceptance.     Plan Discussed with: CRNA and Surgeon  Anesthesia Plan Comments:         Anesthesia Quick Evaluation

## 2012-07-12 NOTE — Op Note (Signed)
OP NOTE DICTATED [id number]

## 2012-07-12 NOTE — Anesthesia Postprocedure Evaluation (Signed)
  Anesthesia Post-op Note  Patient: Stacy Obrien  Procedure(s) Performed: Procedure(s) (LRB) with comments: MAMMARY REDUCTION  (BREAST) (Bilateral)  Patient Location: PACU  Anesthesia Type:General  Level of Consciousness: awake, alert  and oriented  Airway and Oxygen Therapy: Patient Spontanous Breathing and Patient connected to face mask oxygen  Post-op Pain: mild  Post-op Assessment: Post-op Vital signs reviewed  Post-op Vital Signs: Reviewed  Complications: No apparent anesthesia complications

## 2012-07-12 NOTE — H&P (Signed)
Stacy Obrien is an 31 y.o. female.   Chief Complaint: back and shoulder pain with 44DDD BRA SIZE HPI: MANY YEARS OF BREAST DISCOMFORT, ACTIVITY AND CLOTHING RESTRICTIONS SECONDARY TO MAMMARY HYPERTROPHY  Past Medical History  Diagnosis Date  . 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 Dental crown present   . Presence of subdermal contraceptive device     Implanon    Past Surgical History  Procedure Date  . Wisdom tooth extraction     History reviewed. No pertinent family history. Social History:  reports that she has never smoked. She has never used smokeless tobacco. She reports that she does not drink alcohol or use illicit drugs.  Allergies: No Known Allergies  No prescriptions prior to admission    No results found for this or any previous visit (from the past 48 hour(s)). No results found.  ROSNONCONTRIBUTORY  Blood pressure 133/77, pulse 84, temperature 97.9 F (36.6 C), temperature source Oral, resp. rate 18, height 5\' 5"  (1.651 m), weight 84.964 kg (187 lb 5 oz), last menstrual period 06/11/2012, SpO2 100.00%. Physical Exam  Constitutional: She is oriented to person, place, and time. She appears well-developed and well-nourished.  HENT:  Head: Normocephalic.  Eyes: Pupils are equal, round, and reactive to light.  Neck: Neck supple. No JVD present. No tracheal deviation present. No thyromegaly present.  Cardiovascular: Normal rate, regular rhythm, normal heart sounds and intact distal pulses.   Respiratory: Effort normal and breath sounds normal. No respiratory distress. She has no wheezes. She has no rales. She exhibits no tenderness.  GI: Soft. Bowel sounds are normal. She exhibits no distension and no mass. There is no tenderness. There is no rebound and no guarding.  Musculoskeletal: Normal range of motion. She exhibits no edema and no tenderness.  Lymphadenopathy:    She has no cervical adenopathy.  Neurological: She is alert and oriented to person, place,  and time.  Skin: Skin is warm and dry.  Psychiatric: She has a normal mood and affect. Her behavior is normal. Judgment and thought content normal.     Assessment/Plan PLAN BILATERAL BREAST REDUCTION  Sharalyn Lomba 07/12/2012, 12:07 PM

## 2012-07-13 ENCOUNTER — Encounter (HOSPITAL_BASED_OUTPATIENT_CLINIC_OR_DEPARTMENT_OTHER): Payer: Self-pay | Admitting: Plastic Surgery

## 2012-07-13 NOTE — Op Note (Signed)
Stacy Obrien, Stacy Obrien                ACCOUNT NO.:  [id number]  MEDICAL RECORD NO.:  [id number]  LOCATION:                                 FACILITY:  PHYSICIAN:  Consuello Bossier., M.D.DATE OF BIRTH:  [birth year]  DATE OF PROCEDURE:  07/12/2012 DATE OF DISCHARGE:                              OPERATIVE REPORT   PREOPERATIVE DIAGNOSIS:  Symptomatic bilateral mammary hypertrophy.  POSTOPERATIVE DIAGNOSIS:  Symptomatic bilateral mammary hypertrophy.  OPERATION:  Bilateral reduction mammoplasty.  SURGEON:  Pleas Patricia, M.D.  ASSISTANT:  Enedina Finner, RNFA.  ANESTHESIA:  General endotracheal.  FINDINGS:  The patient had significant bilateral mammary hypertrophy with discomfort of chest, upper shoulder, and back area for which the above surgical procedure was carried out.  PROCEDURE IN DETAIL:  The patient was brought to the operating room having been marked in the upright position for the planned surgical procedure.  She was given general endotracheal anesthetic.  Prepped with Betadine and draped with in sterile fashion.  Initially, the keyhole area was epithelialized.  The inferior pedicle was also de- epithelialized.  The incision was made along the medial aspect of the planned vertical nipple areolar graft and was continued down until the pectoralis major fascia was encountered.  Similar procedure was carried out laterally creating the vertical bipedicle nipple areolar graft.  The inframammary incision was made along the medial and lateral segments and continued upward to the level of ___new nipple______ position.  Bleeding was controlled and there was noted to be hemostasis.  A large triangular segment of medial full-thickness breast tissue was removed in continuity with central segment at the level above the umbilicus leaving approximately 1 cm in depth superior pedicle.  Bleeding again was controlled with electrocautery and there was noted to be hemostasis. Just over  a 1000 g were removed from the right breast and 1020 g from the what has appeared to be somewhat larger left breast.  The wound was irrigated with normal saline.  There was noted to be good hemostasis. The medial and lateral flaps were brought together to a predetermined position along the inframammary line with interrupted #2-0 Vicryl. Following this, the nipple was ___sewn_______ with interrupted #3-0 Monocryl, and the vertical and inframammary incisions were also closed with interrupted subcutaneous #3-0 Monocryl.  Inframammary incision was further closed with a running subcuticular #3-0 Monocryl, and the circumareolar and vertical incisions were closed with running #4-0 Monocryl.  Steri-Strips, Xeroform, 4 x 8's, ABD, and cervicothoracic Ace bandage were applied.  The patient tolerated the procedure well, was able to be discharged from the operating room to the recovery room to be admitted back to the [facility] for overnight observation.     Consuello Bossier., M.D.     HH/MEDQ  D:  07/12/2012  T:  07/12/2012  Job:  [id number]  cc:   Enedina Finner

## 2012-08-16 ENCOUNTER — Encounter (HOSPITAL_COMMUNITY): Payer: Self-pay

## 2012-08-16 ENCOUNTER — Emergency Department (HOSPITAL_COMMUNITY)
Admission: EM | Admit: 2012-08-16 | Discharge: 2012-08-16 | Disposition: A | Discharge status: Home / Self Care | Payer: 59 | Source: Home / Self Care | Attending: Family Medicine | Admitting: Family Medicine

## 2012-08-16 DIAGNOSIS — L738 Other specified follicular disorders: Secondary | ICD-10-CM

## 2012-08-16 MED ORDER — DOXYCYCLINE HYCLATE 100 MG PO CAPS: 100.0000 mg | ORAL_CAPSULE | Freq: Two times a day (BID) | ORAL | Status: DC

## 2012-08-16 NOTE — ED Provider Notes (Signed)
History     CSN: [id number]  Arrival date & time 08/16/12  1153   First MD Initiated Contact with Patient 08/16/12 1414      Chief Complaint  Patient presents with  . Rash    (Consider location/radiation/quality/duration/timing/severity/associated sxs/prior treatment) Patient is a 31 y.o. female presenting with rash. The history is provided by the patient.  Rash  This is a new problem. The current episode started more than 1 week ago. The problem has been gradually worsening. Associated with: just had breast red surg, rash seen by dr Stephens November, without dx or rx. There has been no fever. The rash is present on the abdomen and back. The pain is mild. Associated symptoms include blisters, itching and pain.    Past Medical History  Diagnosis Date  . 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 Dental crown present   . Presence of subdermal contraceptive device     Implanon    Past Surgical History  Procedure Date  . Wisdom tooth extraction   . Breast reduction surgery 07/12/2012    Procedure: MAMMARY REDUCTION  (BREAST);  Surgeon: Pleas Patricia, MD;  Location: [facility];  Service: Plastics;  Laterality: Bilateral;    History reviewed. No pertinent family history.  History  Substance Use Topics  . Smoking status: Never Smoker   . Smokeless tobacco: Never Used  . Alcohol Use: No    OB History    Grav Para Term Preterm Abortions TAB SAB Ect Mult Living                  Review of Systems  Constitutional: Negative.   Skin: Positive for itching and rash.    Allergies  Review of patient's allergies indicates no known allergies.  Home Medications   Current Outpatient Rx  Name  Route  Sig  Dispense  Refill  . DOXYCYCLINE HYCLATE 100 MG PO CAPS   Oral   Take 1 capsule (100 mg total) by mouth 2 (two) times daily.   20 capsule   0     BP 122/66  Pulse 100  Temp 98.1 F (36.7 C) (Oral)  Resp 16  SpO2 100%  Physical Exam  Nursing note  and vitals reviewed. Constitutional: She is oriented to person, place, and time. She appears well-developed and well-nourished.  Abdominal: Soft. Bowel sounds are normal.  Neurological: She is alert and oriented to person, place, and time.  Skin: Skin is warm and dry. Rash noted.       Erythematous based lesions with central pustules, others dry and resolving. Areas totally confined to lower abd and lumbar back.    ED Course  Procedures (including critical care time)   Labs Reviewed  CULTURE, ROUTINE-ABSCESS  HERPES SIMPLEX VIRUS CULTURE   No results found.   1. Bacterial folliculitis       MDM          Linna Hoff, MD 08/16/12 [phone]

## 2012-08-16 NOTE — ED Notes (Signed)
Seen by MD only ; NAD

## 2012-08-19 LAB — CULTURE, ROUTINE-ABSCESS: Gram Stain: NONE SEEN

## 2012-08-20 LAB — HERPES SIMPLEX VIRUS CULTURE: Culture: NOT DETECTED

## 2012-08-20 NOTE — ED Notes (Signed)
Patient called to inquire about wound culture, which was resulted as final, positive for staph aureus, sensitive to mediation prescribed. Advised to complete medication. Viral test has not yet been statusd as final, and pt advised to either wait for call or call back in 2-3 days

## 2012-08-24 NOTE — ED Notes (Signed)
Patient called about poss yeast infection, due to antibiotics use. Spoke w Dr Artis Flock, who authorized Rx diflucan 150, 1 now and another in 1 week if still symptomatic. Called in to [facility], [place] at pt request, spoke directly w pharmacist

## 2012-09-11 ENCOUNTER — Ambulatory Visit: Payer: Self-pay | Admitting: Family Medicine

## 2012-10-06 ENCOUNTER — Other Ambulatory Visit: Payer: Self-pay

## 2013-03-13 ENCOUNTER — Encounter: Payer: Self-pay | Admitting: *Deleted

## 2013-03-13 ENCOUNTER — Ambulatory Visit (INDEPENDENT_AMBULATORY_CARE_PROVIDER_SITE_OTHER): Payer: 59 | Admitting: Family Medicine

## 2013-03-13 ENCOUNTER — Encounter: Payer: Self-pay | Admitting: Family Medicine

## 2013-03-13 VITALS — BP 120/88 | HR 73 | Temp 98.2°F | Ht 65.0 in | Wt 191.8 lb

## 2013-03-13 DIAGNOSIS — G43909 Migraine, unspecified, not intractable, without status migrainosus: Secondary | ICD-10-CM

## 2013-03-13 DIAGNOSIS — H8110 Benign paroxysmal vertigo, unspecified ear: Secondary | ICD-10-CM

## 2013-03-13 MED ORDER — MECLIZINE HCL 25 MG PO TABS: 25.0000 mg | ORAL_TABLET | Freq: Three times a day (TID) | ORAL | Status: DC | PRN

## 2013-03-13 MED ORDER — SUMATRIPTAN SUCCINATE 100 MG PO TABS: 100.0000 mg | ORAL_TABLET | ORAL | Status: AC | PRN

## 2013-03-13 MED ORDER — DIAZEPAM 2 MG PO TABS: 2.0000 mg | ORAL_TABLET | Freq: Three times a day (TID) | ORAL | Status: DC | PRN

## 2013-03-13 NOTE — Progress Notes (Signed)
Nature conservation officer at [facility] [address] [place] [address] Phone: [phone] Fax: [phone]  Date:  03/13/2013   Name:  Stacy Obrien   DOB:  March 23, 1981   MRN:  [id number] Gender: female Age: 32 y.o.  Primary Physician:  Ruthe Mannan, MD  Evaluating MD: Hannah Beat, MD   Chief Complaint: Headache   History of Present Illness:  Stacy Obrien is a 32 y.o. pleasant patient who presents with the following:  Headaches.  On Implanon.  LMP 02/15/2013.  Missed work. No trauma. Sound and light are bothering her. But that has only happened twice. No change in her vision.  Cell phone has made it worse.   1 other - 12/2012, bad headache.   Now having headaches twice a month.  No recently.   Cousin with history of migraine Brother had a history of headaches  Now she also feels dizzy with movement like the room is spinning. No previous illnesses.  Patient Active Problem List   Diagnosis Date Noted  . Sinusitis acute 04/17/2012  . Chronic upper back pain 12/15/2011  . CRAMP IN LIMB 01/27/2010  . UTI 09/03/2009  . DYSURIA 09/03/2009    Past Medical History  Diagnosis Date  . 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 Dental crown present   . Presence of subdermal contraceptive device     Implanon    Past Surgical History  Procedure Laterality Date  . Wisdom tooth extraction    . Breast reduction surgery  07/12/2012    Procedure: MAMMARY REDUCTION  (BREAST);  Surgeon: Pleas Patricia, MD;  Location: [facility];  Service: Plastics;  Laterality: Bilateral;    History   Social History  . Marital Status: Single    Spouse Name: N/A    Number of Children: N/A  . Years of Education: N/A   Occupational History  . Not on file.   Social History Main Topics  . Smoking status: Never Smoker   . Smokeless tobacco: Never Used  . Alcohol Use: No  . Drug Use: No  . Sexually Active: Not on file   Other Topics Concern  . Not on  file   Social History Narrative  . No narrative on file    No family history on file.  No Known Allergies  Medication list has been reviewed and updated.  Outpatient Prescriptions Prior to Visit  Medication Sig Dispense Refill  . doxycycline (VIBRAMYCIN) 100 MG capsule Take 1 capsule (100 mg total) by mouth 2 (two) times daily.  20 capsule  0   No facility-administered medications prior to visit.    Review of Systems:  ROS: GEN: Acute illness details above GI: Tolerating PO intake GU: maintaining adequate hydration and urination Pulm: No SOB Interactive and getting along well at home.  Otherwise, ROS is as per the HPI.   Physical Examination: BP 120/88  Pulse 73  Temp(Src) 98.2 F (36.8 C) (Oral)  Ht 5\' 5"  (1.651 m)  Wt 191 lb 12.8 oz (87 kg)  BMI 31.92 kg/m2  SpO2 97%  Ideal Body Weight: Weight in (lb) to have BMI = 25: 149.9   GEN: WDWN, NAD, Non-toxic, A & O x 3 HEENT: Atraumatic, Normocephalic. Neck supple. No masses, No LAD. With rotation of head, inducible vertigo. Bending over and standing up induces vertigo. Ears and Nose: No external deformity. CV: RRR, No M/G/R. No JVD. No thrill. No extra heart sounds. PULM: CTA B, no wheezes, crackles, rhonchi.  No retractions. No resp. distress. No accessory muscle use. ABD: S, NT, ND, +BS. No rebound tenderness. No HSM.  EXTR: No c/c/e  Neuro: CN 2-12 grossly intact. PERRLA. EOMI. Sensation intact throughout. Str 5/5 all extremities. DTR 2+. No clonus. A and o x 4. Romberg neg. Finger nose neg. Heel -shin neg.   PSYCH: Normally interactive. Conversant. Not depressed or anxious appearing.  Calm demeanor.     Assessment and Plan:  Migraine headache  Benign paroxysmal positional vertigo  To me, 2 significant headaches with phonophobia and photophobia c/w migraine. Trial of imitrex.  Classic inducible vertigo.  Orders Today:  No orders of the defined types were placed in this encounter.    Updated  Medication List: (Includes new medications, updates to list, dose adjustments) Meds ordered this encounter  Medications  . SUMAtriptan (IMITREX) 100 MG tablet    Sig: Take 1 tablet (100 mg total) by mouth every 2 (two) hours as needed for migraine.    Dispense:  10 tablet    Refill:  5  . meclizine (ANTIVERT) 25 MG tablet    Sig: Take 1 tablet (25 mg total) by mouth 3 (three) times daily as needed for dizziness.    Dispense:  60 tablet    Refill:  1  . diazepam (VALIUM) 2 MG tablet    Sig: Take 1 tablet (2 mg total) by mouth every 8 (eight) hours as needed (dizziness).    Dispense:  20 tablet    Refill:  0    Medications Discontinued: Medications Discontinued During This Encounter  Medication Reason  . doxycycline (VIBRAMYCIN) 100 MG capsule Error      Signed, Shundra Wirsing T. Kymere Fullington, MD 03/13/2013 10:10 AM

## 2013-03-14 DIAGNOSIS — G43909 Migraine, unspecified, not intractable, without status migrainosus: Secondary | ICD-10-CM | POA: Insufficient documentation

## 2013-06-20 ENCOUNTER — Encounter: Payer: Self-pay | Admitting: Family Medicine

## 2013-06-20 ENCOUNTER — Ambulatory Visit (INDEPENDENT_AMBULATORY_CARE_PROVIDER_SITE_OTHER): Payer: 59 | Admitting: Family Medicine

## 2013-06-20 VITALS — BP 104/64 | HR 65 | Temp 99.3°F | Ht 65.0 in | Wt 180.0 lb

## 2013-06-20 DIAGNOSIS — J029 Acute pharyngitis, unspecified: Secondary | ICD-10-CM

## 2013-06-20 DIAGNOSIS — J0100 Acute maxillary sinusitis, unspecified: Secondary | ICD-10-CM

## 2013-06-20 LAB — POCT RAPID STREP A (OFFICE): Rapid Strep A Screen: NEGATIVE

## 2013-06-20 MED ORDER — AMOXICILLIN 500 MG PO CAPS: 1000.0000 mg | ORAL_CAPSULE | Freq: Two times a day (BID) | ORAL | Status: DC

## 2013-06-20 MED ORDER — FLUCONAZOLE 150 MG PO TABS: 150.0000 mg | ORAL_TABLET | Freq: Once | ORAL | Status: DC

## 2013-06-20 NOTE — Progress Notes (Signed)
Date:  06/20/2013   Name:  Stacy Obrien   DOB:  1981-04-29   MRN:  [id number] Gender: female Age: 32 y.o.  Primary Physician:  Ruthe Mannan, MD   Chief Complaint: Sore Throat, Fever and Sinusitis   History of Present Illness:  Stacy Obrien is a 32 y.o. very pleasant female patient who presents with the following:  Started with a sore throat. Has been going to the gym. Has been taking some antihistamines, and has had some fever which came and went. Now has pain in her sinuses and drainage. Also has some drainage. Now she is having a lot of pain in her maxillary sinus region as well as some nasal drainage. She has had some mild intermittent cough. She currently does have a temperature of 99.3, but not a true fever. She has felt warm per her report.  Past Medical History, Surgical History, Social History, Family History, Problem List, Medications, and Allergies have been reviewed and updated if relevant.  Current Outpatient Prescriptions on File Prior to Visit  Medication Sig Dispense Refill  . meclizine (ANTIVERT) 25 MG tablet Take 1 tablet (25 mg total) by mouth 3 (three) times daily as needed for dizziness.  60 tablet  1  . diazepam (VALIUM) 2 MG tablet Take 1 tablet (2 mg total) by mouth every 8 (eight) hours as needed (dizziness).  20 tablet  0  . SUMAtriptan (IMITREX) 100 MG tablet Take 1 tablet (100 mg total) by mouth every 2 (two) hours as needed for migraine.  10 tablet  5   No current facility-administered medications on file prior to visit.    Review of Systems: ROS: GEN: Acute illness details above GI: Tolerating PO intake GU: maintaining adequate hydration and urination Pulm: No SOB Interactive and getting along well at home.  Otherwise, ROS is as per the HPI.   Physical Examination: BP 104/64  Pulse 65  Temp(Src) 99.3 F (37.4 C) (Oral)  Ht 5\' 5"  (1.651 m)  Wt 180 lb (81.647 kg)  BMI 29.95 kg/m2  LMP 05/27/2013   Gen: WDWN, NAD; alert,appropriate and  cooperative throughout exam  HEENT: Normocephalic and atraumatic. Throat clear, w/o exudate, no LAD, R TM clear, L TM - good landmarks, No fluid present. rhinnorhea.  Left frontal and maxillary sinuses: Tender Right frontal and maxillary sinuses: Tender  Neck: No ant or post LAD CV: RRR, No M/G/R Pulm: Breathing comfortably in no resp distress. no w/c/r Abd: S,NT,ND,+BS Extr: no c/c/e Psych: full affect, pleasant   Assessment and Plan:  Sore throat - Plan: POCT rapid strep A  Maxillary sinusitis, acute  Acute sinusitis: ABX as below.   Reviewed symptomatic care as well as ABX in this case.   Orders Today:  Orders Placed This Encounter  Procedures  . POCT rapid strep A    Updated Medication List: (Includes new medications, updates to list, dose adjustments) Meds ordered this encounter  Medications  . amoxicillin (AMOXIL) 500 MG capsule    Sig: Take 2 capsules (1,000 mg total) by mouth 2 (two) times daily.    Dispense:  40 capsule    Refill:  0  . fluconazole (DIFLUCAN) 150 MG tablet    Sig: Take 1 tablet (150 mg total) by mouth once.    Dispense:  1 tablet    Refill:  0    Medications Discontinued: There are no discontinued medications.    Signed,  Elpidio Galea. Jaydin Boniface, MD, CAQ Sports Medicine  [employer] [facility]  [address] [place] [address] Phone: [phone] Fax: [phone]

## 2013-06-27 ENCOUNTER — Other Ambulatory Visit: Payer: Self-pay

## 2014-02-13 ENCOUNTER — Ambulatory Visit: Payer: 59 | Admitting: Family Medicine

## 2014-02-17 ENCOUNTER — Encounter: Payer: Self-pay | Admitting: *Deleted

## 2014-02-17 ENCOUNTER — Encounter: Payer: Self-pay | Admitting: Family Medicine

## 2014-02-17 ENCOUNTER — Ambulatory Visit (INDEPENDENT_AMBULATORY_CARE_PROVIDER_SITE_OTHER): Payer: 59 | Admitting: Family Medicine

## 2014-02-17 VITALS — BP 118/68 | HR 72 | Temp 98.2°F | Ht 65.5 in | Wt 182.5 lb

## 2014-02-17 DIAGNOSIS — F341 Dysthymic disorder: Secondary | ICD-10-CM

## 2014-02-17 DIAGNOSIS — F32A Depression, unspecified: Secondary | ICD-10-CM | POA: Insufficient documentation

## 2014-02-17 DIAGNOSIS — F329 Major depressive disorder, single episode, unspecified: Secondary | ICD-10-CM | POA: Insufficient documentation

## 2014-02-17 DIAGNOSIS — F419 Anxiety disorder, unspecified: Secondary | ICD-10-CM | POA: Insufficient documentation

## 2014-02-17 MED ORDER — CITALOPRAM HYDROBROMIDE 20 MG PO TABS: 20.0000 mg | ORAL_TABLET | Freq: Every day | ORAL | Status: DC

## 2014-02-17 NOTE — Progress Notes (Signed)
   Subjective:   Patient ID: Stacy Obrien, female    DOB: 02-04-1981, 33 y.o.   MRN: [id number]  Stacy Morikki L Margerum is a pleasant 33 y.o. year old female who presents to clinic today with Follow-up  on 02/17/2014  HPI: Past 3 months, worsening symptoms of anxiety and depression.  Has never taken anti anxiety medications or antidepressants.  Lives with her mom and dad- dad's health is getting worse.  They cannot leave him a lone. This has been a lot of stress on her.  Her adult brothers are not helping.  She is more tearful and not sleeping well. Appetite has deteriorated but she has been trying to lose weight- was exercising three times a week until past month- now does not feel like exercising anymore.  Wt Readings from Last 3 Encounters:  02/17/14 182 lb 8 oz (82.781 kg)  06/20/13 180 lb (81.647 kg)  03/13/13 191 lb 12.8 oz (87 kg)   Work has been good but she has not gone to work in past few weeks because she "does not want to get out of bed."  Denies any SI or HI.  Current Outpatient Prescriptions on File Prior to Visit  Medication Sig Dispense Refill  . SUMAtriptan (IMITREX) 100 MG tablet Take 1 tablet (100 mg total) by mouth every 2 (two) hours as needed for migraine.  10 tablet  5   No current facility-administered medications on file prior to visit.    No Known Allergies  Past Medical History  Diagnosis Date  . 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 Dental crown present   . Presence of subdermal contraceptive device     Implanon    Past Surgical History  Procedure Laterality Date  . Wisdom tooth extraction    . Breast reduction surgery  07/12/2012    Procedure: MAMMARY REDUCTION  (BREAST);  Surgeon: Pleas PatriciaHoward Holderness, MD;  Location: [facility];  Service: Plastics;  Laterality: Bilateral;    No family history on file.  History   Social History  . Marital Status: Single    Spouse Name: N/A    Number of Children: N/A  . Years of Education:  N/A   Occupational History  . Not on file.   Social History Main Topics  . Smoking status: Never Smoker   . Smokeless tobacco: Never Used  . Alcohol Use: No  . Drug Use: No  . Sexual Activity: Not on file   Other Topics Concern  . Not on file   Social History Narrative  . No narrative on file   The PMH, PSH, Social History, Family History, Medications, and allergies [facility], and have been updated if relevant.   Review of Systems    See HPI Objective:    BP 118/68  Pulse 72  Temp(Src) 98.2 F (36.8 C) (Oral)  Ht 5' 5.5" (1.664 m)  Wt 182 lb 8 oz (82.781 kg)  BMI 29.90 kg/m2  SpO2 99%  LMP 01/13/2014   Physical Exam  Gen:  Alert, pleasant, NAD Psych:  Good eye contact, tearful but appropriate      Assessment & Plan:   Anxiety and depression - Plan: Ambulatory referral to Psychology No Follow-up on file.

## 2014-02-17 NOTE — Patient Instructions (Signed)
It was great to see you. We are starting celexa 20 mg daily- ok to cut in 1/2 for first few doses. We will call you with your appointment to see a therapist.  Call me or text in a few weeks.  Hang in there.

## 2014-02-17 NOTE — Assessment & Plan Note (Signed)
>  25 minutes spent in face to face time with patient, >50% spent in counselling or coordination of care Start celexa 20 mg daily, refer to psychotherapy. FMLA paperwork flilled out, copied for our records and returned to pt today.

## 2014-02-17 NOTE — Progress Notes (Signed)
Pre visit review using our clinic review tool, if applicable. No additional management support is needed unless otherwise documented below in the visit note. 

## 2014-02-18 ENCOUNTER — Encounter: Payer: Self-pay | Admitting: *Deleted

## 2014-02-18 ENCOUNTER — Telehealth: Payer: Self-pay | Admitting: Family Medicine

## 2014-02-18 NOTE — Telephone Encounter (Signed)
Great.  Thanks for the update. Ok to write note as pt requests.

## 2014-02-18 NOTE — Telephone Encounter (Signed)
Pt states she was seen yesterday.  Her work note is for Thursday. She states that her office is closed Friday so she wants to know if it can be written for her to be out until Monday 02/24/14.  Please call pt.  She started the new med yesterday (1/2 pill) and it seems to be working okay.  NO side effects.

## 2014-02-18 NOTE — Telephone Encounter (Signed)
Spoke to pt and informed her note will be available for pickup at the front desk

## 2014-02-24 ENCOUNTER — Ambulatory Visit: Payer: 59 | Admitting: Family Medicine

## 2014-03-17 ENCOUNTER — Telehealth: Payer: Self-pay

## 2014-03-17 ENCOUNTER — Encounter: Payer: Self-pay | Admitting: Family Medicine

## 2014-03-17 ENCOUNTER — Telehealth: Payer: Self-pay | Admitting: Family Medicine

## 2014-03-17 NOTE — Telephone Encounter (Signed)
Pt send me a text message asking to be written out of work 7/24 and the week of 7/27 due to anxiety and depression.  Feels celexa is working well but still having a difficult time.  I advised her that I cannot write her out that long but would write an excuse 7/24- 7/29 and she needed to make an appointment with Dr. Laymond PurserPerrin.  Note written.

## 2014-03-17 NOTE — Telephone Encounter (Signed)
Letter already written and she would need to schedule an appt with Dr. Laymond PurserPerrin.

## 2014-03-17 NOTE — Telephone Encounter (Signed)
Pt left v/m requesting letter written to extend her FMLA for this week only; pt said had difficult weekend with dealing with father's illness. Pt also needs to schedule appt with Dr Laymond PurserPerrin; pt wants to know if she should schedule appt with Dr Laymond PurserPerrin or will Dr Elmer SowAron's office do that? Pt request cb.

## 2014-03-17 NOTE — Telephone Encounter (Signed)
Lm on pts vm advising per Dr Dayton MartesAron; pt advised to contact office should she have any additional questions

## 2014-03-18 ENCOUNTER — Ambulatory Visit: Payer: 59 | Admitting: Psychology

## 2014-04-01 ENCOUNTER — Ambulatory Visit: Payer: 59 | Admitting: Psychology

## 2014-10-29 ENCOUNTER — Ambulatory Visit: Payer: Self-pay

## 2014-12-08 ENCOUNTER — Ambulatory Visit (INDEPENDENT_AMBULATORY_CARE_PROVIDER_SITE_OTHER): Payer: BC Managed Care – PPO | Admitting: Family Medicine

## 2014-12-08 ENCOUNTER — Encounter: Payer: Self-pay | Admitting: Family Medicine

## 2014-12-08 ENCOUNTER — Encounter: Payer: Self-pay | Admitting: *Deleted

## 2014-12-08 VITALS — BP 126/72 | HR 77 | Temp 98.3°F | Wt 191.8 lb

## 2014-12-08 DIAGNOSIS — R109 Unspecified abdominal pain: Secondary | ICD-10-CM

## 2014-12-08 LAB — CBC WITH DIFFERENTIAL/PLATELET
Basophils Absolute: 0 10*3/uL (ref 0.0–0.1)
Basophils Relative: 0.6 % (ref 0.0–3.0)
Eosinophils Absolute: 0.1 10*3/uL (ref 0.0–0.7)
Eosinophils Relative: 1.2 % (ref 0.0–5.0)
HCT: 34.9 % — ABNORMAL LOW (ref 36.0–46.0)
Hemoglobin: 11.7 g/dL — ABNORMAL LOW (ref 12.0–15.0)
Lymphocytes Relative: 23.3 % (ref 12.0–46.0)
Lymphs Abs: 2 10*3/uL (ref 0.7–4.0)
MCHC: 33.5 g/dL (ref 30.0–36.0)
MCV: 84.9 fl (ref 78.0–100.0)
Monocytes Absolute: 0.4 10*3/uL (ref 0.1–1.0)
Monocytes Relative: 4.3 % (ref 3.0–12.0)
Neutro Abs: 6 10*3/uL (ref 1.4–7.7)
Neutrophils Relative %: 70.6 % (ref 43.0–77.0)
Platelets: 335 10*3/uL (ref 150.0–400.0)
RBC: 4.11 Mil/uL (ref 3.87–5.11)
RDW: 12.5 % (ref 11.5–15.5)
WBC: 8.5 10*3/uL (ref 4.0–10.5)

## 2014-12-08 LAB — COMPREHENSIVE METABOLIC PANEL
ALT: 18 U/L (ref 0–35)
AST: 21 U/L (ref 0–37)
Albumin: 4 g/dL (ref 3.5–5.2)
Alkaline Phosphatase: 69 U/L (ref 39–117)
BUN: 7 mg/dL (ref 6–23)
CO2: 28 mEq/L (ref 19–32)
Calcium: 9.7 mg/dL (ref 8.4–10.5)
Chloride: 103 mEq/L (ref 96–112)
Creatinine, Ser: 0.74 mg/dL (ref 0.40–1.20)
GFR: 114.53 mL/min (ref >60.00)
Glucose, Bld: 87 mg/dL (ref 70–99)
Potassium: 3.7 mEq/L (ref 3.5–5.1)
Sodium: 137 mEq/L (ref 135–145)
Total Bilirubin: 0.6 mg/dL (ref 0.2–1.2)
Total Protein: 7.6 g/dL (ref 6.0–8.3)

## 2014-12-08 LAB — LIPASE: Lipase: 3 U/L — ABNORMAL LOW (ref 11.0–59.0)

## 2014-12-08 NOTE — Assessment & Plan Note (Signed)
New- persistent. Abdominal exam reassuring- no murphy's sign.   ?IBS- start with OTC pepcid x 2 weeks with prn Beano.  Also discussed foods that may make this worse-keep a food journal. Also would consider lactose free diet and trial of Align or other probiotic. Labs today. Orders Placed This Encounter  Procedures  . CBC with Differential/Platelet  . Lipase  . Comprehensive metabolic panel   If no improvement, proceed with imaging.  The patient indicates understanding of these issues and agrees with the plan.

## 2014-12-08 NOTE — Progress Notes (Signed)
Pre visit review using our clinic review tool, if applicable. No additional management support is needed unless otherwise documented below in the visit note. 

## 2014-12-08 NOTE — Patient Instructions (Signed)
Try minimizing stress and foods that worsen symptoms.   Try over the counter gas-x (four times a day when necessary) or beano for bloating.  Pepcid 20 mg daily before bed for two weeks.    Exclude gas producing foods (beans, onions, celery, carrots, raisins, bananas, apricots, prunes, brussel sprouts, wheat germ, pretzels)   Consider trail of lactose free diet (back off milk)   Trial of Align  for bloating/IBS symptoms (OTC).  Please call me in two weeks with an update.

## 2014-12-08 NOTE — Progress Notes (Signed)
Subjective:   Patient ID: Stacy Obrien, female    DOB: Jan 30, 1981, 34 y.o.   MRN: [id number]  Stacy Obrien is a pleasant 34 y.o. year old female who presents to clinic today with Abdominal Cramping and Abdominal Pain  on 12/08/2014  HPI:  6 months of persistent abdominal bloating.  Does not seem to be worsened by certain foods but she is not sure.   Sometimes diarrhea, other times she feels constipated.  No blood in stool. No vomiting- intermittent abdominal cramping and sometimes nauseated.  Symptoms usually last for an hour or so.  Laying on her side or walking seem to make it better.  Has not tried any rx for this.  Current Outpatient Prescriptions on File Prior to Visit  Medication Sig Dispense Refill  . citalopram (CELEXA) 20 MG tablet Take 1 tablet (20 mg total) by mouth daily. 30 tablet 1  . SUMAtriptan (IMITREX) 100 MG tablet Take 1 tablet (100 mg total) by mouth every 2 (two) hours as needed for migraine. 10 tablet 5   No current facility-administered medications on file prior to visit.    No Known Allergies  Past Medical History  Diagnosis Date  . 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 Dental crown present   . Presence of subdermal contraceptive device     Implanon    Past Surgical History  Procedure Laterality Date  . Wisdom tooth extraction    . Breast reduction surgery  07/12/2012    Procedure: MAMMARY REDUCTION  (BREAST);  Surgeon: Pleas PatriciaHoward Holderness, MD;  Location: [facility];  Service: Plastics;  Laterality: Bilateral;    History reviewed. No pertinent family history.  History   Social History  . Marital Status: Single    Spouse Name: N/A  . Number of Children: N/A  . Years of Education: N/A   Occupational History  . Not on file.   Social History Main Topics  . Smoking status: Never Smoker   . Smokeless tobacco: Never Used  . Alcohol Use: No  . Drug Use: No  . Sexual Activity: Not on file   Other Topics Concern  .  Not on file   Social History Narrative   The PMH, PSH, Social History, Family History, Medications, and allergies [facility], and have been updated if relevant.   Review of Systems  Constitutional: Negative.   HENT: Negative.   Eyes: Negative.   Respiratory: Negative.   Gastrointestinal: Positive for nausea, abdominal pain, diarrhea, constipation and abdominal distention. Negative for vomiting, blood in stool, anal bleeding and rectal pain.  Endocrine: Negative.   Genitourinary: Negative.   Musculoskeletal: Negative.   Skin: Negative.   Allergic/Immunologic: Negative.   Neurological: Negative.   Hematological: Negative.   Psychiatric/Behavioral: Negative.   All other systems reviewed and are negative.      Objective:    BP 126/72 mmHg  Pulse 77  Temp(Src) 98.3 F (36.8 C) (Oral)  Wt 191 lb 12 oz (86.977 kg)  SpO2 97%  LMP 11/10/2014   Physical Exam  Constitutional: She is oriented to person, place, and time. She appears well-developed and well-nourished. No distress.  HENT:  Head: Normocephalic.  Eyes: Conjunctivae are normal.  Neck: Normal range of motion.  Cardiovascular: Normal rate.   Pulmonary/Chest: Effort normal. No respiratory distress. She has no wheezes. She has no rales. She exhibits no tenderness.  Abdominal: Soft. She exhibits no distension and no mass. There is no tenderness. There is  no rebound and no guarding.  Musculoskeletal: Normal range of motion. She exhibits no edema.  Neurological: She is alert and oriented to person, place, and time. No cranial nerve deficit.  Skin: Skin is warm and dry.  Psychiatric: She has a normal mood and affect. Her behavior is normal. Judgment and thought content normal.  Nursing note and vitals reviewed.         Assessment & Plan:   Abdominal cramping - Plan: CBC with Differential/Platelet, Lipase, Comprehensive metabolic panel No Follow-up on file.

## 2014-12-15 ENCOUNTER — Encounter: Payer: Self-pay | Admitting: Family Medicine

## 2014-12-15 ENCOUNTER — Other Ambulatory Visit: Payer: Self-pay | Admitting: Family Medicine

## 2014-12-15 MED ORDER — FLUCONAZOLE 150 MG PO TABS: 150.0000 mg | ORAL_TABLET | Freq: Once | ORAL | Status: AC

## 2014-12-22 ENCOUNTER — Encounter: Payer: Self-pay | Admitting: Family Medicine

## 2014-12-22 NOTE — Telephone Encounter (Signed)
Pt advised via my chart.

## 2015-02-11 ENCOUNTER — Other Ambulatory Visit: Payer: Self-pay | Admitting: Family Medicine

## 2015-02-11 ENCOUNTER — Encounter: Payer: Self-pay | Admitting: Family Medicine

## 2015-02-11 DIAGNOSIS — F329 Major depressive disorder, single episode, unspecified: Secondary | ICD-10-CM

## 2015-02-11 DIAGNOSIS — F32A Depression, unspecified: Secondary | ICD-10-CM

## 2015-02-11 DIAGNOSIS — F419 Anxiety disorder, unspecified: Secondary | ICD-10-CM

## 2015-03-11 ENCOUNTER — Ambulatory Visit: Payer: BC Managed Care – PPO | Admitting: Psychology

## 2015-05-19 ENCOUNTER — Encounter: Payer: Self-pay | Admitting: Family Medicine

## 2015-06-02 ENCOUNTER — Encounter: Payer: Self-pay | Admitting: Family Medicine

## 2015-06-02 ENCOUNTER — Ambulatory Visit (INDEPENDENT_AMBULATORY_CARE_PROVIDER_SITE_OTHER): Payer: 59 | Admitting: Family Medicine

## 2015-06-02 VITALS — BP 102/62 | HR 63 | Temp 98.0°F | Wt 178.0 lb

## 2015-06-02 DIAGNOSIS — R51 Headache: Secondary | ICD-10-CM

## 2015-06-02 DIAGNOSIS — R519 Headache, unspecified: Secondary | ICD-10-CM | POA: Insufficient documentation

## 2015-06-02 DIAGNOSIS — F4323 Adjustment disorder with mixed anxiety and depressed mood: Secondary | ICD-10-CM | POA: Diagnosis not present

## 2015-06-02 NOTE — Assessment & Plan Note (Signed)
>  25 minutes spent in face to face time with patient, >50% spent in counselling or coordination of care Advised taking celexa daily for maximum effect and to prevent rebound headaches and other withdrawal symptoms. She agrees and would like to continue current dose. Call or return to clinic prn if these symptoms worsen or fail to improve as anticipated. The patient indicates understanding of these issues and agrees with the plan.

## 2015-06-02 NOTE — Assessment & Plan Note (Signed)
New- intermittent. No red flag symptoms and exam reassuring. ? Due to withdrawal from SSRI and/or tension. See above.

## 2015-06-02 NOTE — Progress Notes (Signed)
Pre visit review using our clinic review tool, if applicable. No additional management support is needed unless otherwise documented below in the visit note. 

## 2015-06-02 NOTE — Progress Notes (Signed)
Subjective:   Patient ID: Stacy Obrien, female    DOB: 23-Sep-1980, 34 y.o.   MRN: [id number]  Stacy Obrien is a pleasant 34 y.o. year old female who presents to clinic today with Headache  on 06/02/2015  HPI:  Adjustment disorder- feels symptoms much better controlled on celexa 20 mg daily.  When she skips doses, she gets frontal headaches.  Headaches are never associated with photophobia, nausea or vomiting.  No focal neurological deficits.  Has skipped several doses lately- thought she did "not really need it."  Has been walking daily- lost 20 pounds.  Feels good but does feel anxious at work without celexa.  No SI or HI.  Sleeping well.  Appetite good.  Wt Readings from Last 3 Encounters:  06/02/15 178 lb (80.74 kg)  12/08/14 191 lb 12 oz (86.977 kg)  02/17/14 182 lb 8 oz (82.781 kg)   Current Outpatient Prescriptions on File Prior to Visit  Medication Sig Dispense Refill  . citalopram (CELEXA) 20 MG tablet Take 1 tablet (20 mg total) by mouth daily. 30 tablet 1  . SUMAtriptan (IMITREX) 100 MG tablet Take 1 tablet (100 mg total) by mouth every 2 (two) hours as needed for migraine. 10 tablet 5   No current facility-administered medications on file prior to visit.    No Known Allergies  Past Medical History  Diagnosis Date  . 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 Dental crown present   . Presence of subdermal contraceptive device     Implanon    Past Surgical History  Procedure Laterality Date  . Wisdom tooth extraction    . Breast reduction surgery  07/12/2012    Procedure: MAMMARY REDUCTION  (BREAST);  Surgeon: Pleas Patricia, MD;  [facility];  Service: Plastics;  Laterality: Bilateral;    History reviewed. No pertinent family history.  Social History   Social History  . Marital Status: Single    Spouse Name: N/A  . Number of Children: N/A  . Years of Education: N/A   Occupational History  . Not on file.    Social History Main Topics  . Smoking status: Never Smoker   . Smokeless tobacco: Never Used  . Alcohol Use: No  . Drug Use: No  . Sexual Activity: Not on file   Other Topics Concern  . Not on file   Social History Narrative   The PMH, PSH, Social History, Family History, Medications, and allergies have been reviewed in [facility], and have been updated if relevant.   Review of Systems  Constitutional: Negative.   Respiratory: Negative.   Cardiovascular: Negative.   Neurological: Positive for headaches. Negative for dizziness, tremors, seizures, syncope, facial asymmetry, speech difficulty, weakness, light-headedness and numbness.  Psychiatric/Behavioral: Negative.   All other systems reviewed and are negative.      Objective:    BP 102/62 mmHg  Pulse 63  Temp(Src) 98 F (36.7 C) (Oral)  Wt 178 lb (80.74 kg)  LMP 05/11/2015   Physical Exam  Constitutional: She is oriented to person, place, and time. She appears well-developed and well-nourished. No distress.  HENT:  Head: Normocephalic and atraumatic.  Eyes: Conjunctivae are normal.  Neck: Normal range of motion.  Cardiovascular: Normal rate.   Pulmonary/Chest: Effort normal.  Musculoskeletal: Normal range of motion.  Neurological: She is alert and oriented to person, place, and time. No cranial nerve deficit. She exhibits normal muscle tone. Coordination normal.  Skin: Skin is warm and dry.  Psychiatric: She has a normal mood and affect. Judgment and thought content normal.  Nursing note and vitals reviewed.         Assessment & Plan:   Headache, unspecified headache type  Adjustment disorder with mixed anxiety and depressed mood No Follow-up on file.

## 2015-06-24 ENCOUNTER — Ambulatory Visit: Payer: 59 | Admitting: Family Medicine

## 2015-07-20 ENCOUNTER — Encounter: Payer: Self-pay | Admitting: Family Medicine

## 2015-07-20 MED ORDER — CITALOPRAM HYDROBROMIDE 20 MG PO TABS: 20.0000 mg | ORAL_TABLET | Freq: Every day | ORAL | Status: AC

## 2016-03-29 ENCOUNTER — Encounter: Payer: Self-pay | Admitting: Family Medicine

## 2016-03-29 ENCOUNTER — Ambulatory Visit: Payer: 59 | Admitting: Family Medicine

## 2016-03-29 ENCOUNTER — Encounter: Payer: Self-pay | Admitting: *Deleted

## 2016-03-31 ENCOUNTER — Encounter: Payer: Self-pay | Admitting: Family Medicine

## 2016-07-18 ENCOUNTER — Emergency Department
Admission: EM | Admit: 2016-07-18 | Discharge: 2016-07-18 | Disposition: A | Discharge status: Home / Self Care | Payer: BC Managed Care – PPO | Attending: Emergency Medicine | Admitting: Emergency Medicine

## 2016-07-18 DIAGNOSIS — D649 Anemia, unspecified: Secondary | ICD-10-CM | POA: Insufficient documentation

## 2016-07-18 DIAGNOSIS — R110 Nausea: Secondary | ICD-10-CM

## 2016-07-18 DIAGNOSIS — R42 Dizziness and giddiness: Secondary | ICD-10-CM | POA: Diagnosis present

## 2016-07-18 DIAGNOSIS — G4452 New daily persistent headache (NDPH): Secondary | ICD-10-CM | POA: Insufficient documentation

## 2016-07-18 DIAGNOSIS — E876 Hypokalemia: Secondary | ICD-10-CM | POA: Diagnosis not present

## 2016-07-18 LAB — BASIC METABOLIC PANEL
Anion gap: 8 (ref 5–15)
BUN: 7 mg/dL (ref 6–20)
CO2: 28 mmol/L (ref 22–32)
Calcium: 9.5 mg/dL (ref 8.9–10.3)
Chloride: 103 mmol/L (ref 101–111)
Creatinine, Ser: 0.73 mg/dL (ref 0.44–1.00)
GFR calc Af Amer: >60 mL/min (ref >60)
GFR calc non Af Amer: >60 mL/min (ref >60)
Glucose, Bld: 100 mg/dL — ABNORMAL HIGH (ref 65–99)
Potassium: 3.4 mmol/L — ABNORMAL LOW (ref 3.5–5.1)
Sodium: 139 mmol/L (ref 135–145)

## 2016-07-18 LAB — CBC
HCT: 34.2 % — ABNORMAL LOW (ref 35.0–47.0)
Hemoglobin: 11.6 g/dL — ABNORMAL LOW (ref 12.0–16.0)
MCH: 27.8 pg (ref 26.0–34.0)
MCHC: 33.8 g/dL (ref 32.0–36.0)
MCV: 82.3 fL (ref 80.0–100.0)
Platelets: 327 10*3/uL (ref 150–440)
RBC: 4.16 MIL/uL (ref 3.80–5.20)
RDW: 12.9 % (ref 11.5–14.5)
WBC: 9.8 10*3/uL (ref 3.6–11.0)

## 2016-07-18 MED ORDER — METOCLOPRAMIDE HCL 10 MG PO TABS
10.0000 mg | ORAL_TABLET | Freq: Once | ORAL | Status: AC
  Administered 2016-07-18: 10 mg via ORAL
  Filled 2016-07-18: qty 1

## 2016-07-18 MED ORDER — KETOROLAC TROMETHAMINE 10 MG PO TABS: 10.0000 mg | ORAL_TABLET | Freq: Three times a day (TID) | ORAL | 0 refills | Status: AC | PRN

## 2016-07-18 MED ORDER — METOCLOPRAMIDE HCL 5 MG PO TABS: 5.0000 mg | ORAL_TABLET | Freq: Three times a day (TID) | ORAL | 0 refills | Status: AC | PRN

## 2016-07-18 MED ORDER — KETOROLAC TROMETHAMINE 60 MG/2ML IM SOLN
60.0000 mg | Freq: Once | INTRAMUSCULAR | Status: AC
  Administered 2016-07-18: 60 mg via INTRAMUSCULAR
  Filled 2016-07-18: qty 2

## 2016-07-18 NOTE — ED Triage Notes (Signed)
Patient arrives from home with reports of recurrent headaches over the last week  She also reports feelings of lightheadedness and dizziness   Bilateral arm pain with numbness and tingling  [facility] and an MRI was performed  She denies vision changes today

## 2016-07-18 NOTE — Discharge Instructions (Signed)
Please drink plenty of fluids, eat small regular meals throughout the day, and get plenty of rest. Please make an appointment with the neurologist for further evaluation of your symptoms. You may take Reglan for nausea and vomiting. You may take Tylenol or Toradol for your headache. If you're taking Toradol, do not take any other NSAID medications such as Advil, ibuprofen, Motrin or Aleve.  Return to the emergency department if you develop severe pain, changes in vision, fever, lightheadedness or fainting, or any other symptoms concerning to you.

## 2016-07-18 NOTE — ED Notes (Signed)
[facility]. Sx have not improved.

## 2016-07-18 NOTE — ED Provider Notes (Signed)
[facility] Emergency Department Provider Note  ____________________________________________  Time seen: Approximately 12:40 PM  I have reviewed the triage vital signs and the nursing notes.   HISTORY  Chief Complaint Extremity Weakness; Dizziness; and Headache    HPI Stacy Obrien is a 35 y.o. female with a history of migraines presenting with headache, right face, right neck, and right upper extremity pain, dizziness. The patient reports that for several months, she has been having intermittent headaches which have been different than her usual migraines. These headaches have been particularly worse and are now daily over the last 2 weeks. Typically, she describes her migraines as severe throbbing pain requiring her to go to a dark room and occasionally associated with nausea and vomiting. For the past several weeks, she has had a frontal headache which then radiates backwards and is intermittently associated with right facial pain, right neck pain, and right upper extremity pain or tingling. Occasionally, she has associated lightheadedness and dizziness, which is worse with positional changes such as bending down. She was evaluated in the [facility] ED 11/21 with an MRI and MRV which did not show any acute process.She was discharged with Fioricet, which does help the pain. In addition, she is sometimes able to control her pain with ibuprofen. She has not had any fever, chills, tick bite, trauma. Last week she had some mild blurred vision which has completely resolved.  FH: no fam hx of MS or lupus, cerebral aneurysm   Past Medical History:  Diagnosis Date  . Breast hypertrophy 06/2012   bilateral  . Dental crown present   . Presence of subdermal contraceptive device    Implanon    Patient Active Problem List   Diagnosis Date Noted  . Headache 06/02/2015  . Adjustment disorder with mixed anxiety and depressed mood 06/02/2015    Past Surgical History:   Procedure Laterality Date  . BREAST REDUCTION SURGERY  07/12/2012   Procedure: MAMMARY REDUCTION  (BREAST);  Surgeon: Pleas Patricia, MD;  Location: [facility];  Service: Plastics;  Laterality: Bilateral;  . WISDOM TOOTH EXTRACTION      Current Outpatient Rx  . Order #: [id number] Class: Normal  . Order #: [id number] Class: Print  . Order #: [id number] Class: Print  . Order #: [id number] Class: Print    Allergies Patient has no known allergies.  No family history on file.  Social History Social History  Substance Use Topics  . Smoking status: Never Smoker  . Smokeless tobacco: Never Used  . Alcohol use No    Review of Systems Constitutional: No fever/chills.Positive lightheadedness and dizziness. No syncope. Eyes: Blurred vision now resolved. No double vision. ENT: No sore throat. No congestion or rhinorrhea. No dental caries or abscess. No ear pain. Cardiovascular: Denies chest pain. Denies palpitations. Respiratory: Denies shortness of breath.  No cough. Gastrointestinal: No abdominal pain.  Positive nausea, no vomiting.  No diarrhea.  No constipation. Genitourinary: Negative for dysuria. Musculoskeletal: Negative for back pain. Positive for right lateral neck pain. Skin: Negative for rash. Neurological: Positive for headaches. Positive for blurred vision now resolved. Positive for right upper extremity tingling. Positive for right facial and right neck pain.   10-point ROS otherwise negative.  ____________________________________________   PHYSICAL EXAM:  VITAL SIGNS: ED Triage Vitals  Enc Vitals Group     BP 07/18/16 0908 136/85     Pulse Rate 07/18/16 0908 70     Resp 07/18/16 0908 18     Temp 07/18/16 0908 97.7 F (36.5  C)     Temp Source 07/18/16 0908 Oral     SpO2 07/18/16 0908 100 %     Weight 07/18/16 0908 189 lb (85.7 kg)     Height 07/18/16 0908 5\' 5"  (1.651 m)     Head Circumference --      Peak Flow --      Pain Score 07/18/16 0909 5      Pain Loc --      Pain Edu? --      Excl. in GC? --     Constitutional: Alert and oriented. Well appearing and in no acute distress. Answers questions appropriately.Sitting comfortably in a stretcher and able to move around well without significant discomfort. Eyes: Conjunctivae are normal.  EOMI. PERRLA. No scleral icterus. No eye discharge. Head: Atraumatic. No raccoon eyes. No Battle sign. No tenderness to palpation over the temporal areas bilaterally. Nose: No congestion/rhinnorhea. Mouth/Throat: Mucous membranes are moist. No obvious dental caries, dental abscess. Neck: No stridor.  Supple.  No JVD. No meningismus. Cardiovascular: Normal rate, regular rhythm. No murmurs, rubs or gallops.  Respiratory: Normal respiratory effort.  No accessory muscle use or retractions. Lungs CTAB.  No wheezes, rales or ronchi. Gastrointestinal: Soft, nontender and nondistended.  No guarding or rebound.  No peritoneal signs. Musculoskeletal: No LE edema. No ttp in the calves or palpable cords.  Negative Homan's sign. Neurologic: Alert and oriented 3. Speech is clear. Face and smile symmetric. Tongue is midline. EOMI and PERRLA. Normal visual fields bilaterally. Normal cheek puff. No pronator drift. 5 out of 5 grip, biceps, triceps, hip flexors, plantar flexion and dorsiflexion. Normal sensation to light touch in the bilateral upper and lower extremities, and face. No tingling in the right upper extremity on my examination. Normal heel-to-shin. Positive mild lightheadedness with standing. Skin:  Skin is warm, dry and intact. No rash noted. Psychiatric: Mood and affect are normal. Speech and behavior are normal.  Normal judgement.  ____________________________________________   LABS (all labs ordered are listed, but only abnormal results are displayed)  Labs Reviewed  CBC - Abnormal; Notable for the following:       Result Value   Hemoglobin 11.6 (*)    HCT 34.2 (*)    All other components within  normal limits  BASIC METABOLIC PANEL - Abnormal; Notable for the following:    Potassium 3.4 (*)    Glucose, Bld 100 (*)    All other components within normal limits   ____________________________________________  EKG  ED ECG REPORT I, Rockne MenghiniNorman, Anne-Caroline, the attending physician, personally viewed and interpreted this ECG.   Date: 07/18/2016  EKG Time: 1057  Rate: 64  Rhythm: normal sinus rhythm  Axis: normal  Intervals:none  ST&T Change: Nonspecific T-wave inversions in V1. No ST elevation.  ____________________________________________  RADIOLOGY  No results found.  ____________________________________________   PROCEDURES  Procedure(s) performed: None  Procedures  Critical Care performed: No ____________________________________________   INITIAL IMPRESSION / ASSESSMENT AND PLAN / ED COURSE  Pertinent labs & imaging results that were available during my care of the patient were reviewed by me and considered in my medical decision making (see chart for details).  35 y.o. female with a history of migraines presenting with several weeks of progressively worsening headaches, right face and right neck pain, and right upper extremity tingling. The patient has early had imaging negative for intracranial mass, intracranial bleed, or venous sinus thrombosis. At this time, the patient has no focal neurologic deficits on my examination although she is  symptomatically lightheaded with standing. The differential diagnosis is still large including idiopathic intracranial hypertension, POTTS, atypical migraine. I do not suspect meningitis. The patient and I have had a long discussion about symptomatically treatment with neurology follow-up, and she has deferred lumbar puncture at this time. I think this is reasonable given the length of time that she has been symptomatic. I will treat the patient with Toradol and Reglan, and discharge her with these medications as she does not feel  that she needs Fioricet. I have attempted to call Dr. Alver FisherShaw's office, but unfortunately they're closed for lunch. I've offered for the patient to wait until the office reopens, but she prefers to be discharged and call for an outpatient follow-up appointment and I believe she is reliable to do so. We had a long discussion about return precautions as well as follow-up instructions in both the patient and her family member understand.  ____________________________________________  FINAL CLINICAL IMPRESSION(S) / ED DIAGNOSES  Final diagnoses:  New persistent daily headache  Nausea    Clinical Course       NEW MEDICATIONS STARTED DURING THIS VISIT:  New Prescriptions   KETOROLAC (TORADOL) 10 MG TABLET    Take 1 tablet (10 mg total) by mouth every 8 (eight) hours as needed for moderate pain (with food).   METOCLOPRAMIDE (REGLAN) 5 MG TABLET    Take 1 tablet (5 mg total) by mouth every 8 (eight) hours as needed for nausea or vomiting.      Rockne MenghiniAnne-Caroline Ludwin Flahive, MD 07/18/16 1257

## 2019-06-04 ENCOUNTER — Other Ambulatory Visit: Payer: Self-pay | Admitting: Medical Oncology

## 2019-06-04 DIAGNOSIS — R946 Abnormal results of thyroid function studies: Secondary | ICD-10-CM

## 2019-06-10 ENCOUNTER — Ambulatory Visit
Admission: RE | Admit: 2019-06-10 | Discharge: 2019-06-10 | Disposition: A | Discharge status: Home / Self Care | Payer: Medicaid Other | Source: Ambulatory Visit | Attending: Medical Oncology | Admitting: Medical Oncology

## 2019-06-10 DIAGNOSIS — R946 Abnormal results of thyroid function studies: Secondary | ICD-10-CM | POA: Diagnosis present

## 2019-06-17 ENCOUNTER — Other Ambulatory Visit: Payer: Self-pay | Admitting: Medical Oncology

## 2019-06-17 DIAGNOSIS — Z1231 Encounter for screening mammogram for malignant neoplasm of breast: Secondary | ICD-10-CM

## 2019-08-07 ENCOUNTER — Telehealth: Payer: Self-pay | Admitting: Obstetrics & Gynecology

## 2019-08-07 NOTE — Telephone Encounter (Signed)
Called and spoke with patient to schedule her referral. Patient reports she is already establish with another OB-GYN wants [place] to cancel the request

## 2019-08-29 ENCOUNTER — Ambulatory Visit
Admission: RE | Admit: 2019-08-29 | Discharge: 2019-08-29 | Disposition: A | Discharge status: Home / Self Care | Payer: Medicaid Other | Source: Ambulatory Visit | Attending: Medical Oncology | Admitting: Medical Oncology

## 2019-08-29 ENCOUNTER — Ambulatory Visit: Payer: Medicaid Other

## 2019-08-29 ENCOUNTER — Other Ambulatory Visit: Payer: Self-pay

## 2019-08-29 DIAGNOSIS — Z1231 Encounter for screening mammogram for malignant neoplasm of breast: Secondary | ICD-10-CM | POA: Diagnosis not present

## 2020-03-26 ENCOUNTER — Ambulatory Visit: Payer: Medicaid Other | Attending: Family Medicine

## 2020-03-26 ENCOUNTER — Other Ambulatory Visit: Payer: Self-pay | Admitting: Family Medicine

## 2020-03-26 DIAGNOSIS — R109 Unspecified abdominal pain: Secondary | ICD-10-CM

## 2020-08-25 ENCOUNTER — Other Ambulatory Visit: Payer: Self-pay | Admitting: Medical Oncology

## 2020-08-25 DIAGNOSIS — Z1231 Encounter for screening mammogram for malignant neoplasm of breast: Secondary | ICD-10-CM

## 2020-11-03 ENCOUNTER — Ambulatory Visit
Admission: RE | Admit: 2020-11-03 | Discharge: 2020-11-03 | Disposition: A | Discharge status: Home / Self Care | Payer: Medicaid Other | Source: Ambulatory Visit | Attending: Medical Oncology | Admitting: Medical Oncology

## 2020-11-03 ENCOUNTER — Other Ambulatory Visit: Payer: Self-pay

## 2020-11-03 DIAGNOSIS — Z1231 Encounter for screening mammogram for malignant neoplasm of breast: Secondary | ICD-10-CM | POA: Insufficient documentation

## 2021-10-04 ENCOUNTER — Other Ambulatory Visit: Payer: Self-pay | Admitting: Family Medicine

## 2021-10-04 DIAGNOSIS — Z1231 Encounter for screening mammogram for malignant neoplasm of breast: Secondary | ICD-10-CM

## 2021-11-18 ENCOUNTER — Ambulatory Visit
Admission: RE | Admit: 2021-11-18 | Discharge: 2021-11-18 | Disposition: A | Discharge status: Home / Self Care | Payer: Medicaid Other | Source: Ambulatory Visit | Attending: Family Medicine | Admitting: Family Medicine

## 2021-11-18 DIAGNOSIS — Z1231 Encounter for screening mammogram for malignant neoplasm of breast: Secondary | ICD-10-CM | POA: Insufficient documentation

## 2021-11-26 ENCOUNTER — Ambulatory Visit: Payer: Medicaid Other

## 2022-09-27 ENCOUNTER — Other Ambulatory Visit: Payer: Self-pay | Admitting: Physician Assistant

## 2022-09-27 DIAGNOSIS — Z1231 Encounter for screening mammogram for malignant neoplasm of breast: Secondary | ICD-10-CM

## 2022-11-21 ENCOUNTER — Ambulatory Visit
Admission: RE | Admit: 2022-11-21 | Discharge: 2022-11-21 | Disposition: A | Discharge status: Home / Self Care | Payer: Medicaid Other | Source: Ambulatory Visit | Attending: Physician Assistant | Admitting: Physician Assistant

## 2022-11-21 DIAGNOSIS — Z1231 Encounter for screening mammogram for malignant neoplasm of breast: Secondary | ICD-10-CM | POA: Insufficient documentation

## 2023-11-02 ENCOUNTER — Other Ambulatory Visit: Payer: Self-pay | Admitting: Family Medicine

## 2023-11-02 DIAGNOSIS — Z1231 Encounter for screening mammogram for malignant neoplasm of breast: Secondary | ICD-10-CM

## 2023-11-10 IMAGING — MG MM DIGITAL SCREENING BILAT W/ TOMO AND CAD
8 series · 8 of 24 positions shown · non-contrast
Comparison: Previous exam(s).

CLINICAL DATA: Screening.

EXAM:
DIGITAL SCREENING BILATERAL MAMMOGRAM WITH TOMOSYNTHESIS AND CAD
TECHNIQUE: Bilateral screening digital craniocaudal and mediolateral oblique
mammograms were obtained. Bilateral screening digital breast
tomosynthesis was performed. The images were evaluated with
computer-aided detection.

[L CC synth-2D]
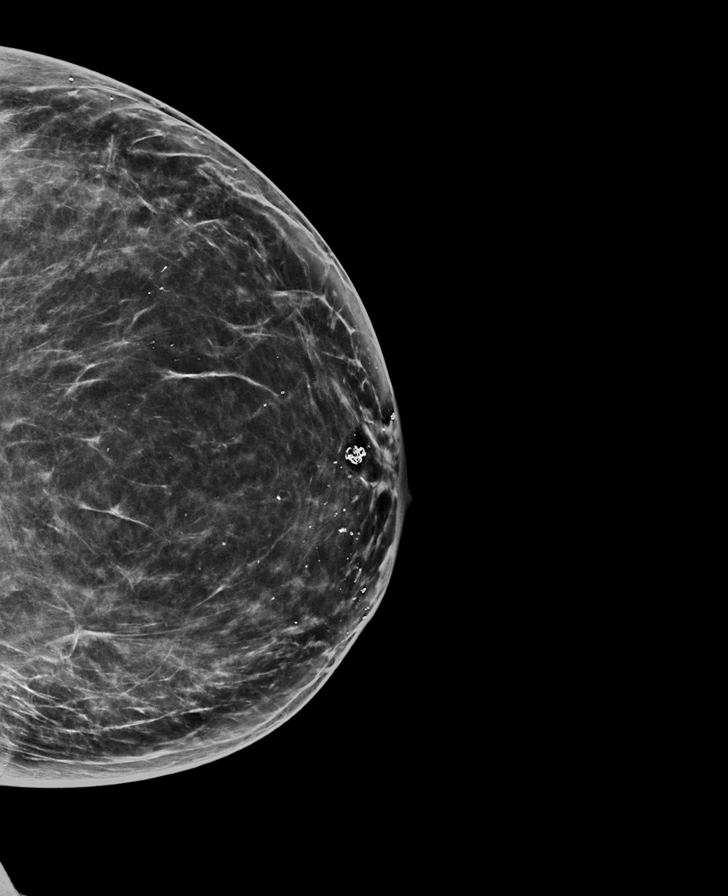

[R CC synth-2D]
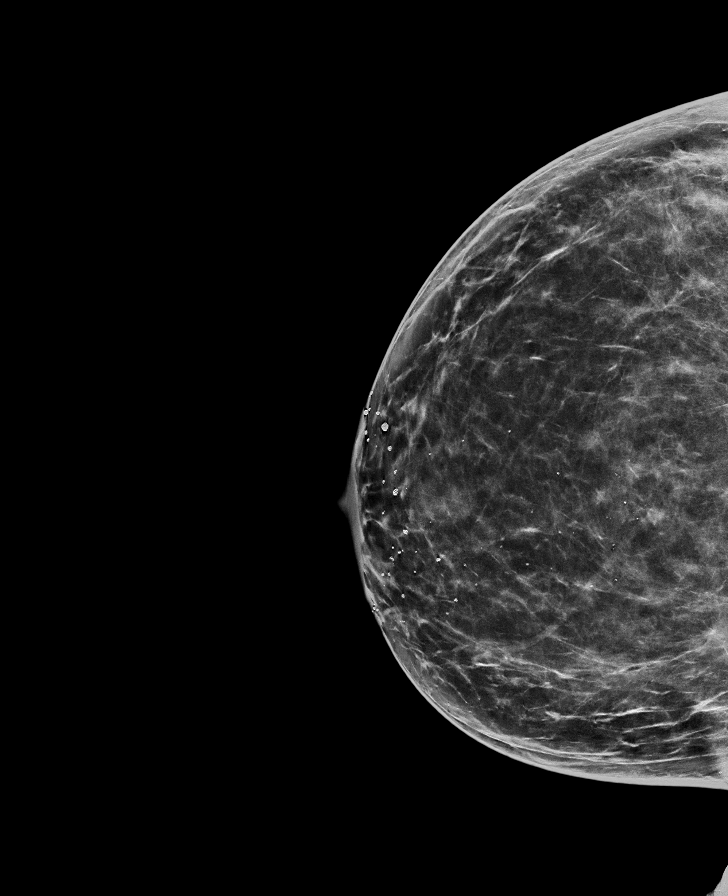

[R MLO synth-2D]
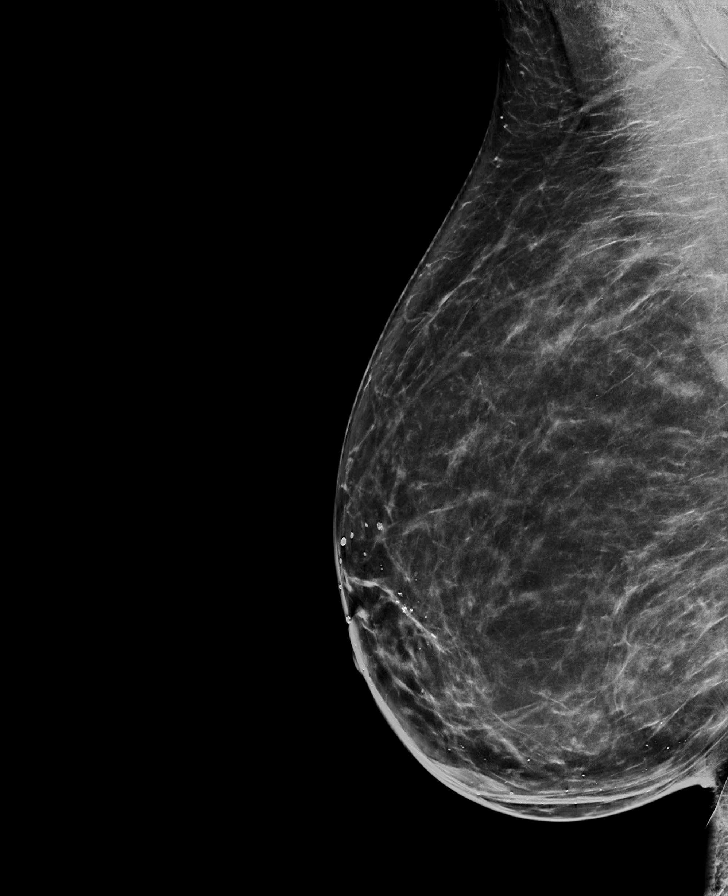

[L MLO synth-2D]
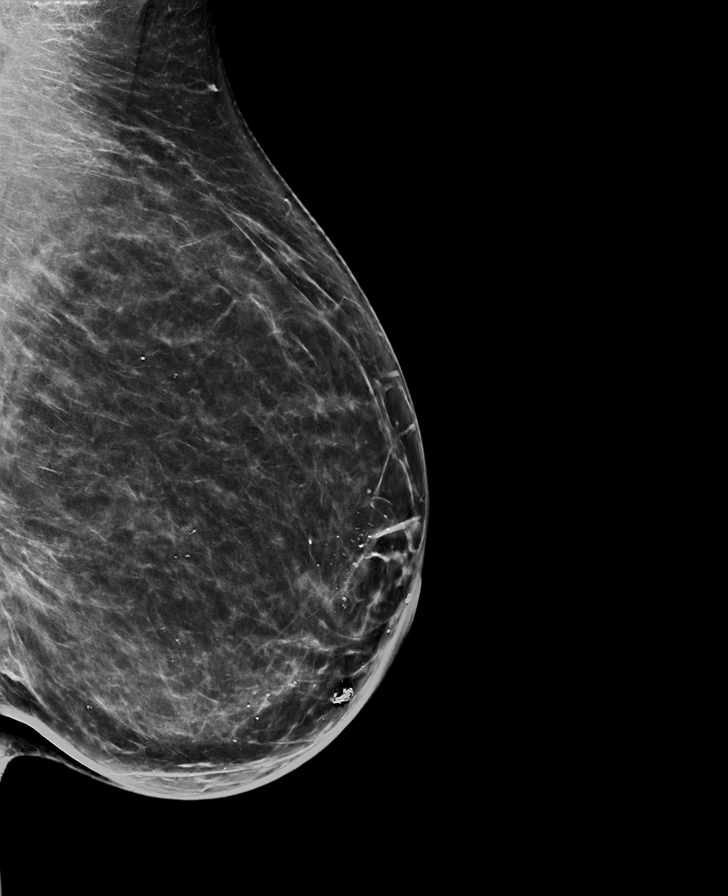

[R CC tomo · tomo slice 42/83.0]
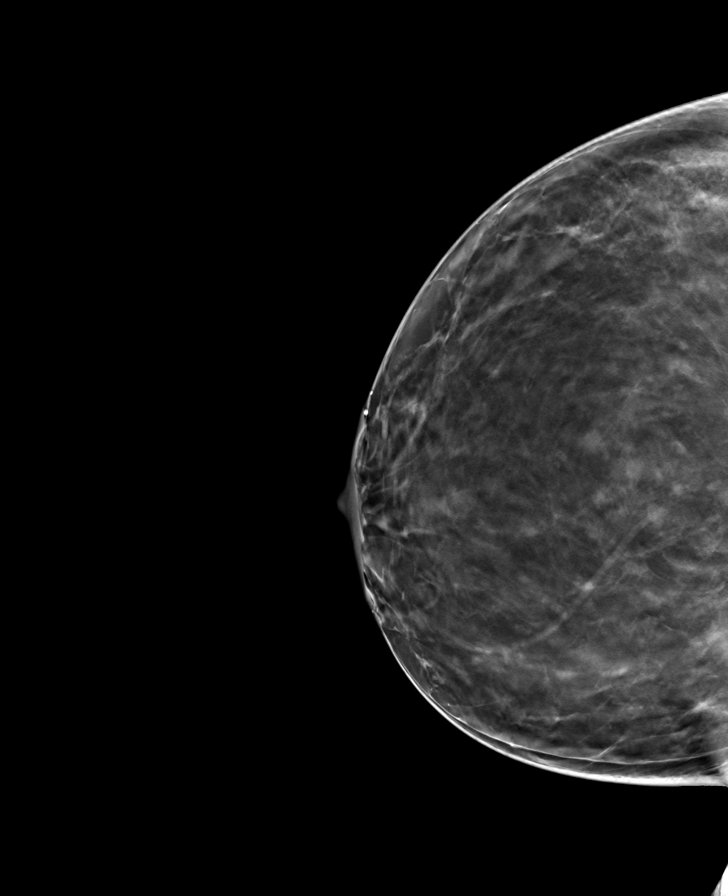

[L MLO tomo · tomo slice 47/94.0]
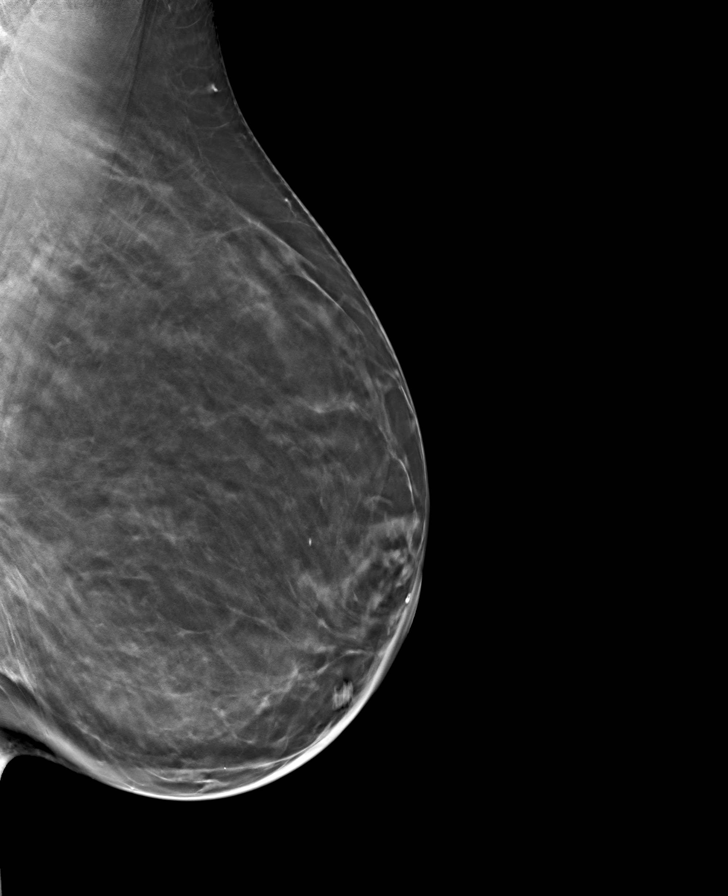

[R MLO tomo · tomo slice 48/95.0]
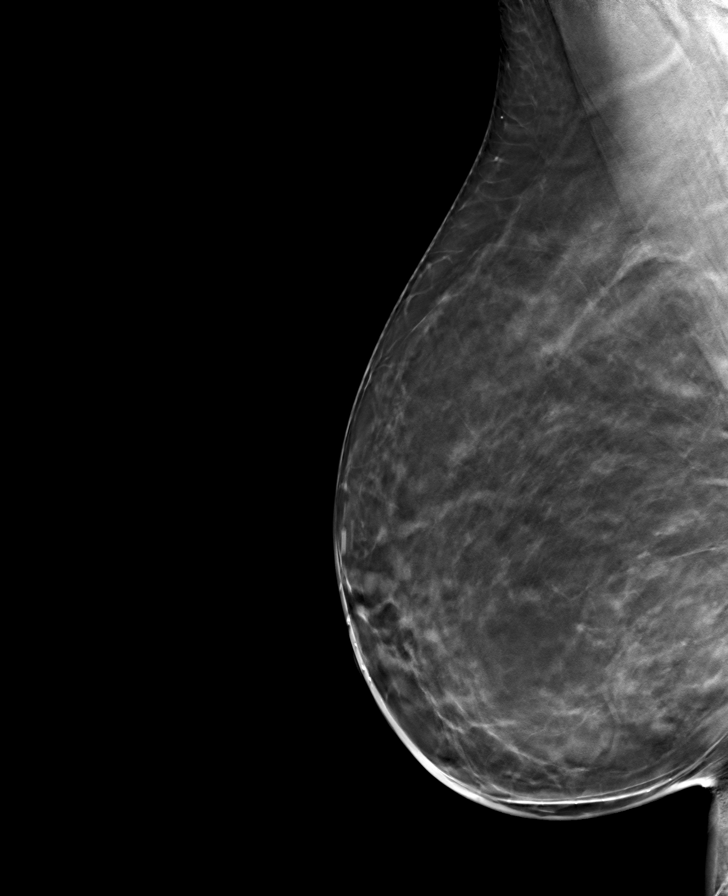

[L CC tomo · tomo slice 44/87.0]
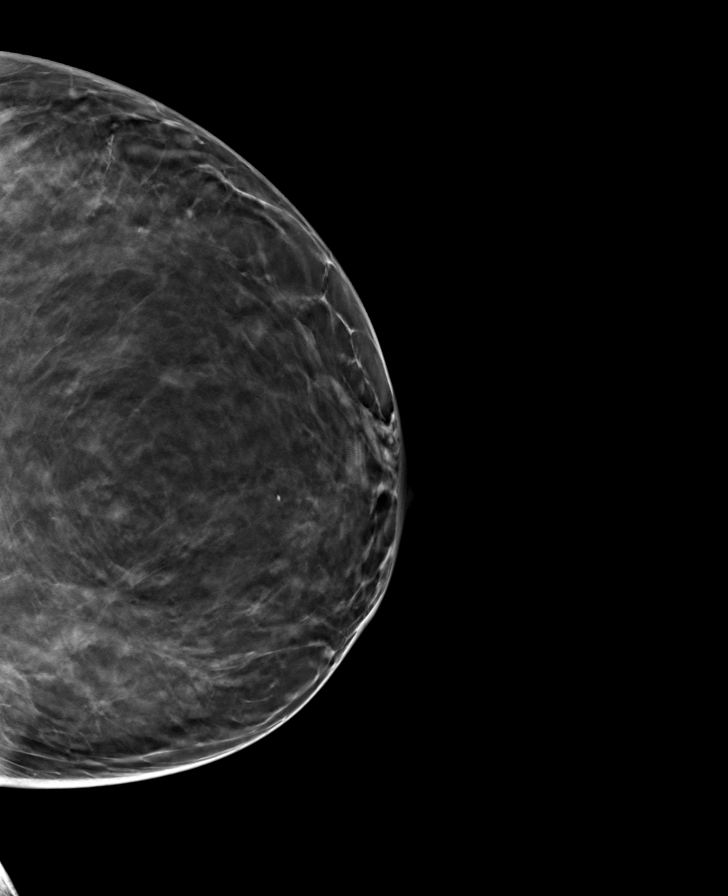

[8 of 24 positions shown; findings below may reference images not displayed]

ACR Breast Density Category b: There are scattered areas of
fibroglandular density.
FINDINGS: There are no findings suspicious for malignancy.
IMPRESSION: No mammographic evidence of malignancy. A result letter of this
screening mammogram will be mailed directly to the patient.

RECOMMENDATION:
Screening mammogram in one year. (Code:51-O-LD2)

BI-RADS CATEGORY  1: Negative.

## 2023-11-28 ENCOUNTER — Ambulatory Visit

## 2023-12-14 ENCOUNTER — Ambulatory Visit
Admission: RE | Admit: 2023-12-14 | Discharge: 2023-12-14 | Disposition: A | Discharge status: Home / Self Care | Source: Ambulatory Visit | Attending: Family Medicine | Admitting: Family Medicine

## 2023-12-14 DIAGNOSIS — Z1231 Encounter for screening mammogram for malignant neoplasm of breast: Secondary | ICD-10-CM | POA: Insufficient documentation
